# Patient Record
Sex: Female | Born: 1950
Health system: Southern US, Community
[De-identification: ages and names within clinical notes are randomized; demographics above are authoritative.]

## PROBLEM LIST (undated history)

## (undated) DIAGNOSIS — I10 Essential (primary) hypertension: Secondary | ICD-10-CM

---

## 1999-08-14 ENCOUNTER — Encounter: Payer: Self-pay | Admitting: Family Medicine

## 1999-08-14 ENCOUNTER — Ambulatory Visit (HOSPITAL_COMMUNITY): Admission: RE | Admit: 1999-08-14 | Discharge: 1999-08-14 | Payer: Self-pay | Admitting: Family Medicine

## 2008-08-10 ENCOUNTER — Other Ambulatory Visit: Admission: RE | Admit: 2008-08-10 | Discharge: 2008-08-10 | Payer: Self-pay | Admitting: Family Medicine

## 2008-08-17 ENCOUNTER — Ambulatory Visit (HOSPITAL_COMMUNITY): Admission: RE | Admit: 2008-08-17 | Discharge: 2008-08-17 | Payer: Self-pay | Admitting: Family Medicine

## 2009-05-20 ENCOUNTER — Encounter: Admission: RE | Admit: 2009-05-20 | Discharge: 2009-05-20 | Payer: Self-pay | Admitting: Otolaryngology

## 2009-05-23 ENCOUNTER — Encounter: Payer: Self-pay | Admitting: Otolaryngology

## 2009-05-24 ENCOUNTER — Ambulatory Visit (HOSPITAL_COMMUNITY): Admission: RE | Admit: 2009-05-24 | Discharge: 2009-05-24 | Payer: Self-pay | Admitting: Interventional Radiology

## 2009-06-19 ENCOUNTER — Ambulatory Visit (HOSPITAL_COMMUNITY): Admission: RE | Admit: 2009-06-19 | Discharge: 2009-06-20 | Payer: Self-pay | Admitting: Interventional Radiology

## 2009-07-05 ENCOUNTER — Encounter: Payer: Self-pay | Admitting: Interventional Radiology

## 2009-08-19 ENCOUNTER — Encounter: Admission: RE | Admit: 2009-08-19 | Discharge: 2009-08-19 | Payer: Self-pay | Admitting: Family Medicine

## 2009-11-01 ENCOUNTER — Ambulatory Visit (HOSPITAL_COMMUNITY): Admission: RE | Admit: 2009-11-01 | Discharge: 2009-11-01 | Payer: Self-pay | Admitting: Interventional Radiology

## 2010-03-07 ENCOUNTER — Ambulatory Visit (HOSPITAL_COMMUNITY)
Admission: RE | Admit: 2010-03-07 | Discharge: 2010-03-07 | Payer: Self-pay | Source: Home / Self Care | Attending: Interventional Radiology | Admitting: Interventional Radiology

## 2010-03-10 LAB — CBC
HCT: 39.2 % (ref 36.0–46.0)
Hemoglobin: 13.3 g/dL (ref 12.0–15.0)
MCH: 31.4 pg (ref 26.0–34.0)
MCHC: 33.9 g/dL (ref 30.0–36.0)
MCV: 92.7 fL (ref 78.0–100.0)
Platelets: 263 10*3/uL (ref 150–400)
RBC: 4.23 MIL/uL (ref 3.87–5.11)
RDW: 12.3 % (ref 11.5–15.5)
WBC: 4.5 10*3/uL (ref 4.0–10.5)

## 2010-03-10 LAB — BASIC METABOLIC PANEL
BUN: 8 mg/dL (ref 6–23)
CO2: 25 mEq/L (ref 19–32)
Calcium: 10 mg/dL (ref 8.4–10.5)
Chloride: 100 mEq/L (ref 96–112)
Creatinine, Ser: 1 mg/dL (ref 0.4–1.2)
GFR calc Af Amer: >60 mL/min (ref >60)
GFR calc non Af Amer: 57 mL/min — ABNORMAL LOW (ref >60)
Glucose, Bld: 133 mg/dL — ABNORMAL HIGH (ref 70–99)
Potassium: 4.4 mEq/L (ref 3.5–5.1)
Sodium: 137 mEq/L (ref 135–145)

## 2010-03-10 LAB — PROTIME-INR
INR: 1.05 (ref 0.00–1.49)
Prothrombin Time: 13.9 seconds (ref 11.6–15.2)

## 2010-03-10 LAB — APTT: aPTT: 31 seconds (ref 24–37)

## 2010-03-16 ENCOUNTER — Encounter: Payer: Self-pay | Admitting: Family Medicine

## 2010-03-17 ENCOUNTER — Encounter: Payer: Self-pay | Admitting: Family Medicine

## 2010-05-01 ENCOUNTER — Other Ambulatory Visit (HOSPITAL_COMMUNITY): Payer: Self-pay | Admitting: Interventional Radiology

## 2010-05-01 DIAGNOSIS — I770 Arteriovenous fistula, acquired: Secondary | ICD-10-CM

## 2010-05-08 LAB — BASIC METABOLIC PANEL
BUN: 11 mg/dL (ref 6–23)
GFR calc Af Amer: >60 mL/min (ref >60)
GFR calc non Af Amer: 59 mL/min — ABNORMAL LOW (ref >60)
Potassium: 3.8 mEq/L (ref 3.5–5.1)
Sodium: 138 mEq/L (ref 135–145)

## 2010-05-08 LAB — CBC
HCT: 39.4 % (ref 36.0–46.0)
RBC: 4.31 MIL/uL (ref 3.87–5.11)
RDW: 12.2 % (ref 11.5–15.5)
WBC: 3.6 10*3/uL — ABNORMAL LOW (ref 4.0–10.5)

## 2010-05-08 LAB — APTT: aPTT: 27 seconds (ref 24–37)

## 2010-05-08 LAB — PROTIME-INR: INR: 0.92 (ref 0.00–1.49)

## 2010-05-12 ENCOUNTER — Other Ambulatory Visit (HOSPITAL_COMMUNITY)
Admission: RE | Admit: 2010-05-12 | Discharge: 2010-05-12 | Disposition: A | Discharge status: Home / Self Care | Payer: BC Managed Care – PPO | Source: Ambulatory Visit | Attending: Family Medicine | Admitting: Family Medicine

## 2010-05-12 ENCOUNTER — Other Ambulatory Visit: Payer: Self-pay | Admitting: Family Medicine

## 2010-05-12 DIAGNOSIS — Z124 Encounter for screening for malignant neoplasm of cervix: Secondary | ICD-10-CM | POA: Insufficient documentation

## 2010-05-13 LAB — DIFFERENTIAL
Lymphocytes Relative: 62 % — ABNORMAL HIGH (ref 12–46)
Lymphs Abs: 2.4 10*3/uL (ref 0.7–4.0)
Neutrophils Relative %: 26 % — ABNORMAL LOW (ref 43–77)

## 2010-05-13 LAB — BASIC METABOLIC PANEL
BUN: 15 mg/dL (ref 6–23)
Calcium: 8.1 mg/dL — ABNORMAL LOW (ref 8.4–10.5)
Creatinine, Ser: 0.95 mg/dL (ref 0.4–1.2)
Creatinine, Ser: 0.96 mg/dL (ref 0.4–1.2)
GFR calc Af Amer: >60 mL/min (ref >60)
GFR calc Af Amer: >60 mL/min (ref >60)
GFR calc non Af Amer: 60 mL/min — ABNORMAL LOW (ref >60)
GFR calc non Af Amer: >60 mL/min (ref >60)
Potassium: 3.7 mEq/L (ref 3.5–5.1)

## 2010-05-13 LAB — CBC
HCT: 37.3 % (ref 36.0–46.0)
MCHC: 34.7 g/dL (ref 30.0–36.0)
Platelets: 177 10*3/uL (ref 150–400)
RBC: 3.25 MIL/uL — ABNORMAL LOW (ref 3.87–5.11)
WBC: 3.9 10*3/uL — ABNORMAL LOW (ref 4.0–10.5)
WBC: 7.3 10*3/uL (ref 4.0–10.5)

## 2010-05-13 LAB — PROTIME-INR
INR: 0.91 (ref 0.00–1.49)
Prothrombin Time: 12.2 seconds (ref 11.6–15.2)

## 2010-05-13 LAB — HEPARIN ANTI-XA: Heparin LMW: <0.1 IU/mL

## 2010-05-13 LAB — MRSA PCR SCREENING: MRSA by PCR: NEGATIVE

## 2010-05-14 LAB — PROTIME-INR: Prothrombin Time: 13.1 seconds (ref 11.6–15.2)

## 2010-05-14 LAB — CBC
MCV: 95.1 fL (ref 78.0–100.0)
RBC: 4.35 MIL/uL (ref 3.87–5.11)
WBC: 6.4 10*3/uL (ref 4.0–10.5)

## 2010-05-14 LAB — BASIC METABOLIC PANEL
Chloride: 104 mEq/L (ref 96–112)
Creatinine, Ser: 0.94 mg/dL (ref 0.4–1.2)
GFR calc Af Amer: >60 mL/min (ref >60)
Potassium: 3.9 mEq/L (ref 3.5–5.1)

## 2010-05-29 ENCOUNTER — Other Ambulatory Visit (HOSPITAL_COMMUNITY): Payer: Self-pay

## 2010-05-30 ENCOUNTER — Encounter (HOSPITAL_COMMUNITY)
Admission: RE | Admit: 2010-05-30 | Discharge: 2010-05-30 | Disposition: A | Discharge status: Home / Self Care | Payer: BC Managed Care – PPO | Source: Ambulatory Visit | Attending: Interventional Radiology | Admitting: Interventional Radiology

## 2010-05-30 LAB — CBC
Hemoglobin: 12.7 g/dL (ref 12.0–15.0)
MCH: 32.2 pg (ref 26.0–34.0)
RBC: 3.95 MIL/uL (ref 3.87–5.11)
WBC: 4.1 10*3/uL (ref 4.0–10.5)

## 2010-05-30 LAB — BASIC METABOLIC PANEL
CO2: 28 mEq/L (ref 19–32)
Chloride: 106 mEq/L (ref 96–112)
GFR calc Af Amer: >60 mL/min (ref >60)
Potassium: 3.8 mEq/L (ref 3.5–5.1)
Sodium: 137 mEq/L (ref 135–145)

## 2010-05-30 LAB — SURGICAL PCR SCREEN
MRSA, PCR: NEGATIVE
Staphylococcus aureus: NEGATIVE

## 2010-05-30 LAB — APTT: aPTT: 28 seconds (ref 24–37)

## 2010-05-30 LAB — DIFFERENTIAL
Basophils Absolute: 0 10*3/uL (ref 0.0–0.1)
Basophils Relative: 1 % (ref 0–1)
Monocytes Relative: 6 % (ref 3–12)
Neutro Abs: 1.2 10*3/uL — ABNORMAL LOW (ref 1.7–7.7)
Neutrophils Relative %: 29 % — ABNORMAL LOW (ref 43–77)

## 2010-05-30 LAB — PROTIME-INR: INR: 0.93 (ref 0.00–1.49)

## 2010-06-05 ENCOUNTER — Ambulatory Visit (HOSPITAL_COMMUNITY): Payer: BC Managed Care – PPO

## 2010-06-05 ENCOUNTER — Inpatient Hospital Stay (HOSPITAL_COMMUNITY)
Admission: RE | Admit: 2010-06-05 | Discharge: 2010-06-06 | DRG: 110 | Disposition: A | Discharge status: Home / Self Care | Payer: BC Managed Care – PPO | Source: Ambulatory Visit | Attending: Interventional Radiology | Admitting: Interventional Radiology

## 2010-06-05 DIAGNOSIS — I770 Arteriovenous fistula, acquired: Principal | ICD-10-CM | POA: Diagnosis present

## 2010-06-05 DIAGNOSIS — D649 Anemia, unspecified: Secondary | ICD-10-CM | POA: Diagnosis present

## 2010-06-05 DIAGNOSIS — I498 Other specified cardiac arrhythmias: Secondary | ICD-10-CM | POA: Diagnosis present

## 2010-06-05 DIAGNOSIS — E876 Hypokalemia: Secondary | ICD-10-CM | POA: Diagnosis present

## 2010-06-06 LAB — CBC
Hemoglobin: 10.3 g/dL — ABNORMAL LOW (ref 12.0–15.0)
MCHC: 33.9 g/dL (ref 30.0–36.0)
RDW: 13 % (ref 11.5–15.5)
WBC: 8.2 10*3/uL (ref 4.0–10.5)

## 2010-06-06 LAB — PROTIME-INR
INR: 1.1 (ref 0.00–1.49)
Prothrombin Time: 14.4 seconds (ref 11.6–15.2)

## 2010-06-06 LAB — BASIC METABOLIC PANEL
BUN: 9 mg/dL (ref 6–23)
Calcium: 8.3 mg/dL — ABNORMAL LOW (ref 8.4–10.5)
GFR calc non Af Amer: 56 mL/min — ABNORMAL LOW (ref >60)
Glucose, Bld: 102 mg/dL — ABNORMAL HIGH (ref 70–99)

## 2010-06-06 LAB — POCT ACTIVATED CLOTTING TIME: Activated Clotting Time: 205 seconds

## 2010-06-06 LAB — APTT: aPTT: 27 seconds (ref 24–37)

## 2010-06-10 NOTE — H&P (Signed)
Kelli Flores, Kelli Flores            ACCOUNT NO.:  [id number]  MEDICAL RECORD NO.:  [id number]           PATIENT TYPE:  O  LOCATION:  [facility]                         FACILITY:  [facility]  PHYSICIAN:  Kelli Flores, M.D.DATE OF BIRTH:  [birth year]  DATE OF ADMISSION:  06/05/2010 DATE OF DISCHARGE:                             HISTORY & PHYSICAL   CHIEF COMPLAINT:  Dural AV fistula.  BRIEF HISTORY:  Kelli Flores is a very pleasant 60 year old female who was initially referred to Kelli Flores through the courtesy of Dr. Suzanna Flores for evaluation of pulsatile tinnitus.  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  She had a cerebral angiogram on May 24, 2009 that confirmed the arteriovenous fistula.  She underwent embolization of the fistula on June 19, 2009 performed by Kelli Flores under general anesthesia.  She had a follow-up angiogram on March 07, 2010, that showed the angiographically obliterated left occipital branch as well as a posterior auricular branch of the left external carotid artery.  There was a persistent opacification of the medial aspect of the previous arteriovenous fistula with branches from the abnormally prominent ascending pharyngeal artery.  Kelli Flores discussed these findings with the patient and a decision was made to proceed with further endovascular embolization.  The patient is admitted to [facility] on June 05, 2010 for that procedure.  PAST MEDICAL HISTORY:  Significant for the embolization as noted above. She also has a history of hypertension.  Otherwise, she has been very healthy.  SURGICAL HISTORY:  The patient has had a tubal ligation as well as hemorrhoid surgery.  She reports that she is slow to wake up after anesthesia.  ALLERGIES:  THE PATIENT IS ALLERGIC TO CODEINE AND SHELLFISH.  She has received a 13-hour prednisone protocol as well as Benadryl and Pepcid  prior to this intervention.  She also received these medications prior to her previous embolization and angiograms.  CURRENT MEDICATIONS:  Vitamin D as well as atenolol/chlorthalidone.  As noted she had a 13-hour prednisone prep prior to admission.  SOCIAL HISTORY:  The patient is divorced.  She has one son.  She lives in [place].  She has never smoked.  She drinks alcohol occasionally. 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  FAMILY HISTORY:  The patient's mother died at age 80 from congestive heart failure and kidney failure.  Her father died at age 30 from congestive heart failure.  REVIEW OF SYSTEMS:  The review of systems was completely negative except for bilateral carpal tunnel syndrome and some mild arthritis.  LABORATORY DATA:  An INR was 0.93, PTT was 28, BUN was 9, creatinine 0.97, potassium was 3.8, GFR was greater than 60, glucose was 89. Hemoglobin 12.7, hematocrit 36.4, WBC 4100, platelets 207,000.  PHYSICAL EXAM:  GENERAL:  Revealed a very pleasant 60 year old African American female in no acute distress. VITAL SIGNS:  Blood pressure 116/76, pulse 84, respirations 18, temperature 97.5, oxygen saturation 99% on room air. HEENT: Unremarkable. HEART:  Revealed regular rate and rhythm without murmur. LUNGS:  Clear.  EXTREMITIES:  Revealed pulses to be intact without edema. The patient's airway was rated at A1, her ASA scale was A3. NEUROLOGIC:  Revealed the patient to be alert and oriented able to follow three-step commands.  Cranial nerves II-XII are grossly intact. Sensation was intact to light touch.  Cerebellar testing was intact. Motor strength was 5/5 throughout.  IMPRESSION: 1. History of a dural AV fistula, which was partially embolized on     June 19, 2009.  The patient returns today for further embolization     to be performed endovascularly by Kelli Flores under general     anesthesia. 2. History of pulsatile tinnitus which  resolved after the procedure     June 19, 2009. 3. History of carpal tunnel syndrome. 4. History of hypertension. 5. History of sinus problems. 6. History of allergies to shellfish and codeine treated with a 13-     hour prednisone protocol as well as Benadryl and Pepcid.     Delton See, P.A.   ______________________________ Grandville Silos. Kelli Flores, M.D.    DR/MEDQ  D:  06/05/2010  T:  06/05/2010  Job:  [id number]  cc:   Kelli Flores, M.D.  Electronically Signed by Delton See P.A. on 06/06/2010 01:50:03 PM Electronically Signed by Julieanne Cotton M.D. on 06/10/2010 03:55:01 PM

## 2010-06-10 NOTE — Discharge Summary (Signed)
NAMESENA, Kelli Flores            ACCOUNT NO.:  [id number]  MEDICAL RECORD NO.:  [id number]           PATIENT TYPE:  I  LOCATION:  3113                         FACILITY:  [facility]  PHYSICIAN:  Lee Kalt K. Annjanette Wertenberger, M.D.DATE OF BIRTH:  15-Jul-1950  DATE OF ADMISSION:  06/05/2010 DATE OF DISCHARGE:  06/06/2010                              DISCHARGE SUMMARY   CHIEF COMPLAINT:  Dural AV fistula.  BRIEF HISTORY:  Kelli Flores is very pleasant 60 year old female initially referred to Dr. Corliss Skains through the courtesy of Dr. 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  The patient had been suffering with pulsatile tinnitus.  She had a cerebral angiogram on May 24, 2009, that confirmed the arteriovenous fistula.  She underwent partial embolization of the fistula on June 19, 2009, by Dr. Corliss Skains. A followup angiogram on March 07, 2010, showed persistence of a portion of the arteriovenous fistula in the medial aspect.  These findings were discussed with the patient.  A decision was made to proceed with further treatment.  The patient was admitted to [facility] on June 05, 2010, for intervention.  PAST MEDICAL HISTORY:  Significant for the above-noted embolization. She has a history of hypertension.  She has otherwise been very healthy.  SURGICAL HISTORY:  The patient had a tubal ligation as well as hemorrhoid surgery in the past.  She reports that she is slow to wake up after anesthesia.  ALLERGIES:  The patient is allergic to CODEINE and SHELLFISH.  She received a 13-hour prednisone protocol as well as Benadryl and Pepcid prior to admission.  CURRENT MEDICATIONS:  The patient is on a vitamin D supplement as well as atenolol/chlorthalidone combination for her blood pressure.  SOCIAL HISTORY:  The patient is divorced.  She has one son.  She lives in [place].  She has never smoked.  She drinks  alcohol occasionally. She works as a Diplomatic Services operational officer for [employer].  FAMILY HISTORY:  The patient's mother died at age 64 from congestive heart failure and kidney failure.  Her father died at age 5 from congestive heart failure.  HOSPITAL COURSE:  The patient was admitted to [facility] on June 05, 2010, for further embolization of a dural AV fistula.  The initial embolization was performed on June 19, 2009.  A followup cerebral angiogram on March 07, 2010, showed persistence of a portion of the arteriovenous fistula.  On the day of admission, the patient underwent further coiling and Onyx therapy performed by Dr. Corliss Skains under general anesthesia.  Please see his dictated note for full details.  It was felt that the fistula was completely embolized at this point.  Following the procedure, the patient was admitted to the neuro intensive care unit where she remained overnight.  She was treated with IV heparin per Dr. Fatima Sanger protocol.  The following day, the patient was feeling well.  She denied headache, dizziness, or pulsatile tinnitus. The patient was found to have a low potassium level which was supplemented.  She was also noted to have a  low hemoglobin/hematocrit which was felt secondary to fluid hydration.  She had a slow heart rate in the 40s which was felt secondary to her atenolol.  She was asymptomatic with this heart rate.  The heart rate responded appropriately with increased activity.  Initially, her atenolol was held but it was given prior to discharge.  The patient was discharged later in the day on June 06, 2010, in stable and improved condition.  LABORATORY DATA:  A CBC on the day of discharge revealed hemoglobin 10.3, hematocrit 30.4, WBCs 8200, platelets 164,000.  A basic metabolic panel on the day of discharge revealed a BUN of 9, creatinine 1.01, GFR was 56, potassium was 3.3, glucose was 102.  On May 30, 2010,  the patient's hemoglobin had been 12.7, hematocrit 36.4.  DISCHARGE MEDICATIONS:  The patient was told to continue the same medication she was taking prior to admission which included the vitamin D supplement as well as the atenolol/chlorthalidone combination for her hypertension.  The patient was given instructions regarding wound care.  She was told not to do anything strenuous for at least 2 weeks.  She was not to return to work for 2 weeks.  She will follow up with Dr. Corliss Skains in 2 weeks.  She was told to follow up with her primary care physician, Dr. Zachery Dauer and her ENT, Dr. Jearld Fenton as scheduled.  DISCHARGE DIAGNOSES: 1. Dural AV fistula partially embolized on June 19, 2009, with     further embolization performed June 05, 2010. 2. History of hypertension. 3. History of allergy to shellfish and codeine.  The patient was given     a 13-hour prednisone protocol, as well as Benadryl and Pepcid prior     to her interventions. 4. Hypokalemia supplemented. 5. Mild anemia secondary to hydration. 6. Bradycardia, asymptomatic, although the patient was told to follow     up with her primary care physician for further evaluation. 7. History of carpal tunnel syndrome. 8. History of sinus problems.     Delton See, P.A.   ______________________________ Grandville Silos. Corliss Skains, M.D.    DR/MEDQ  D:  06/06/2010  T:  06/07/2010  Job:  [id number]  cc:   Suzanna Obey, M.D. Dr. Juluis Rainier  Electronically Signed by Delton See P.A. on 06/09/2010 11:02:54 AM Electronically Signed by Julieanne Cotton M.D. on 06/10/2010 03:55:02 PM

## 2010-06-19 ENCOUNTER — Other Ambulatory Visit (HOSPITAL_COMMUNITY): Payer: BC Managed Care – PPO

## 2010-06-23 ENCOUNTER — Ambulatory Visit (HOSPITAL_COMMUNITY)
Admission: RE | Admit: 2010-06-23 | Discharge: 2010-06-23 | Disposition: A | Discharge status: Home / Self Care | Payer: BC Managed Care – PPO | Source: Ambulatory Visit | Attending: Interventional Radiology | Admitting: Interventional Radiology

## 2010-07-16 ENCOUNTER — Other Ambulatory Visit (HOSPITAL_COMMUNITY): Payer: Self-pay | Admitting: Family Medicine

## 2010-07-16 DIAGNOSIS — Z1231 Encounter for screening mammogram for malignant neoplasm of breast: Secondary | ICD-10-CM

## 2010-09-01 ENCOUNTER — Ambulatory Visit (HOSPITAL_COMMUNITY)
Admission: RE | Admit: 2010-09-01 | Discharge: 2010-09-01 | Disposition: A | Discharge status: Home / Self Care | Payer: BC Managed Care – PPO | Source: Ambulatory Visit | Attending: Family Medicine | Admitting: Family Medicine

## 2010-09-01 DIAGNOSIS — Z1231 Encounter for screening mammogram for malignant neoplasm of breast: Secondary | ICD-10-CM | POA: Insufficient documentation

## 2010-12-29 ENCOUNTER — Other Ambulatory Visit (HOSPITAL_COMMUNITY): Payer: Self-pay | Admitting: Interventional Radiology

## 2010-12-29 DIAGNOSIS — Z09 Encounter for follow-up examination after completed treatment for conditions other than malignant neoplasm: Secondary | ICD-10-CM

## 2010-12-29 DIAGNOSIS — L988 Other specified disorders of the skin and subcutaneous tissue: Secondary | ICD-10-CM

## 2011-01-08 ENCOUNTER — Other Ambulatory Visit (HOSPITAL_COMMUNITY): Payer: BC Managed Care – PPO

## 2011-01-08 ENCOUNTER — Ambulatory Visit (HOSPITAL_COMMUNITY): Payer: BC Managed Care – PPO

## 2011-01-08 ENCOUNTER — Other Ambulatory Visit (HOSPITAL_COMMUNITY): Payer: Self-pay

## 2011-01-08 ENCOUNTER — Other Ambulatory Visit (HOSPITAL_COMMUNITY): Payer: Self-pay | Admitting: Interventional Radiology

## 2011-01-08 DIAGNOSIS — Z01812 Encounter for preprocedural laboratory examination: Secondary | ICD-10-CM

## 2011-01-09 ENCOUNTER — Ambulatory Visit (HOSPITAL_COMMUNITY)
Admission: RE | Admit: 2011-01-09 | Discharge: 2011-01-09 | Disposition: A | Discharge status: Home / Self Care | Payer: BC Managed Care – PPO | Source: Ambulatory Visit | Attending: Interventional Radiology | Admitting: Interventional Radiology

## 2011-01-09 DIAGNOSIS — Z09 Encounter for follow-up examination after completed treatment for conditions other than malignant neoplasm: Secondary | ICD-10-CM | POA: Insufficient documentation

## 2011-01-09 DIAGNOSIS — L988 Other specified disorders of the skin and subcutaneous tissue: Secondary | ICD-10-CM

## 2011-01-09 DIAGNOSIS — I729 Aneurysm of unspecified site: Secondary | ICD-10-CM | POA: Insufficient documentation

## 2011-01-09 MED ORDER — GADOBENATE DIMEGLUMINE 529 MG/ML IV SOLN
15.0000 mL | Freq: Once | INTRAVENOUS | Status: AC
  Administered 2011-01-09: 15 mL via INTRAVENOUS

## 2011-07-01 ENCOUNTER — Telehealth (HOSPITAL_COMMUNITY): Payer: Self-pay

## 2011-07-01 NOTE — Telephone Encounter (Signed)
Called Mrs. Grissom in regards to scheduling her f/u angio.

## 2011-07-03 ENCOUNTER — Other Ambulatory Visit (HOSPITAL_COMMUNITY): Payer: Self-pay | Admitting: Interventional Radiology

## 2011-07-03 DIAGNOSIS — I671 Cerebral aneurysm, nonruptured: Secondary | ICD-10-CM

## 2011-07-03 DIAGNOSIS — I729 Aneurysm of unspecified site: Secondary | ICD-10-CM

## 2011-07-08 ENCOUNTER — Other Ambulatory Visit: Payer: Self-pay | Admitting: Radiology

## 2011-07-08 ENCOUNTER — Telehealth (HOSPITAL_COMMUNITY): Payer: Self-pay

## 2011-07-08 NOTE — Telephone Encounter (Signed)
Called Kelli Flores about her shellfish ax. Left a message for her to call me back.

## 2011-07-10 ENCOUNTER — Ambulatory Visit (HOSPITAL_COMMUNITY): Payer: BC Managed Care – PPO

## 2011-07-16 ENCOUNTER — Encounter (HOSPITAL_COMMUNITY): Payer: Self-pay | Admitting: Pharmacy Technician

## 2011-07-17 ENCOUNTER — Ambulatory Visit (HOSPITAL_COMMUNITY)
Admission: RE | Admit: 2011-07-17 | Discharge: 2011-07-17 | Disposition: A | Discharge status: Home / Self Care | Payer: BC Managed Care – PPO | Source: Ambulatory Visit | Attending: Interventional Radiology | Admitting: Interventional Radiology

## 2011-07-17 ENCOUNTER — Other Ambulatory Visit (HOSPITAL_COMMUNITY): Payer: Self-pay | Admitting: Interventional Radiology

## 2011-07-17 ENCOUNTER — Encounter (HOSPITAL_COMMUNITY): Payer: Self-pay

## 2011-07-17 DIAGNOSIS — H9319 Tinnitus, unspecified ear: Secondary | ICD-10-CM | POA: Insufficient documentation

## 2011-07-17 DIAGNOSIS — I671 Cerebral aneurysm, nonruptured: Secondary | ICD-10-CM | POA: Insufficient documentation

## 2011-07-17 DIAGNOSIS — I10 Essential (primary) hypertension: Secondary | ICD-10-CM | POA: Insufficient documentation

## 2011-07-17 DIAGNOSIS — I729 Aneurysm of unspecified site: Secondary | ICD-10-CM

## 2011-07-17 HISTORY — DX: Essential (primary) hypertension: I10

## 2011-07-17 LAB — BASIC METABOLIC PANEL
CO2: 25 mEq/L (ref 19–32)
Calcium: 9.9 mg/dL (ref 8.4–10.5)
Chloride: 92 mEq/L — ABNORMAL LOW (ref 96–112)
Glucose, Bld: 122 mg/dL — ABNORMAL HIGH (ref 70–99)
Potassium: 4.2 mEq/L (ref 3.5–5.1)
Sodium: 132 mEq/L — ABNORMAL LOW (ref 135–145)

## 2011-07-17 LAB — CBC
HCT: 40 % (ref 36.0–46.0)
Hemoglobin: 14 g/dL (ref 12.0–15.0)
MCV: 92 fL (ref 78.0–100.0)
Platelets: 253 10*3/uL (ref 150–400)
RBC: 4.35 MIL/uL (ref 3.87–5.11)
WBC: 6 10*3/uL (ref 4.0–10.5)

## 2011-07-17 LAB — APTT: aPTT: 28 seconds (ref 24–37)

## 2011-07-17 LAB — DIFFERENTIAL
Eosinophils Relative: 0 % (ref 0–5)
Lymphocytes Relative: 22 % (ref 12–46)
Lymphs Abs: 1.3 10*3/uL (ref 0.7–4.0)
Monocytes Relative: 1 % — ABNORMAL LOW (ref 3–12)

## 2011-07-17 LAB — PROTIME-INR: Prothrombin Time: 12.8 seconds (ref 11.6–15.2)

## 2011-07-17 MED ORDER — FENTANYL CITRATE 0.05 MG/ML IJ SOLN
INTRAMUSCULAR | Status: AC
  Filled 2011-07-17: qty 4

## 2011-07-17 MED ORDER — MIDAZOLAM HCL 2 MG/2ML IJ SOLN
INTRAMUSCULAR | Status: AC
  Filled 2011-07-17: qty 4

## 2011-07-17 MED ORDER — IOHEXOL 300 MG/ML  SOLN
150.0000 mL | Freq: Once | INTRAMUSCULAR | Status: AC | PRN
  Administered 2011-07-17: 65 mL via INTRA_ARTERIAL

## 2011-07-17 MED ORDER — SODIUM CHLORIDE 0.9 % IV SOLN
Freq: Once | INTRAVENOUS | Status: AC
  Administered 2011-07-17: 1000 mL via INTRAVENOUS

## 2011-07-17 MED ORDER — SODIUM CHLORIDE 0.9 % IV SOLN: INTRAVENOUS | Status: AC

## 2011-07-17 MED ORDER — FENTANYL CITRATE 0.05 MG/ML IJ SOLN
INTRAMUSCULAR | Status: AC | PRN
  Administered 2011-07-17: 12.5 ug via INTRAVENOUS
  Administered 2011-07-17: 25 ug via INTRAVENOUS

## 2011-07-17 MED ORDER — MIDAZOLAM HCL 5 MG/5ML IJ SOLN
INTRAMUSCULAR | Status: AC | PRN
  Administered 2011-07-17: 1 mg via INTRAVENOUS
  Administered 2011-07-17: 0.5 mg via INTRAVENOUS

## 2011-07-17 MED ORDER — HEPARIN SOD (PORK) LOCK FLUSH 100 UNIT/ML IV SOLN
INTRAVENOUS | Status: AC | PRN
  Administered 2011-07-17 (×2): 500 [IU] via INTRAVENOUS

## 2011-07-17 NOTE — Procedures (Signed)
S/P Bilateral carotid and lt vert artery fistula Rt CFA approach Preliminary findings 1.samll  avfistula at skull base fed by musculo collateral fed by Baldemar Friday A

## 2011-07-17 NOTE — Discharge Instructions (Signed)

## 2011-07-17 NOTE — H&P (Signed)
Chief Complaint: Hx of left sided dural AVF embolization. HPI: Kelli Flores is an 61 y.o. female with hx of (L)dural AVF emb in 2011 and subsequent in 2012. She has done well and is scheduled for her 1 year follow up angio. She does report the onset of some ringing in her (L)ear that started about 2 months ago. She mainly notices it at night when it is quiet, but also when she feels under a lot of stress. No new headaches, dizziness, hearing or vision loss. She has been started on an ASA 81mg  by her PCP a few months ago, states her BP is well controlled. No other recent illnesses or complaints  Past Medical History:  Past Medical History  Diagnosis Date  . Hypertension     Past Surgical History: (L) dural AVF embolization  Family History: History reviewed. No pertinent family history.  Social History:  reports that she has never smoked. She does not have any smokeless tobacco history on file. She reports that she does not drink alcohol. Her drug history not on file.  Allergies:  Allergies  Allergen Reactions  . Shellfish Allergy Hives  . Codeine Rash    Medications: ASA, Tenoretic, Calcium, Vit D  Please HPI for pertinent positives, otherwise complete 10 system ROS negative.  Physical Exam: Blood pressure 133/77, pulse 47, temperature 96.8 F (36 C), temperature source Oral, resp. rate 18, height 5' 4.5" (1.638 m), weight 145 lb (65.772 kg), SpO2 100.00%. Body mass index is 24.50 kg/(m^2).   General Appearance:  Alert, cooperative, no distress, appears stated age  Head:  Normocephalic, without obvious abnormality, atraumatic  ENT: Unremarkable  Neck: Supple, symmetrical, trachea midline, no adenopathy, thyroid: not enlarged, symmetric, no tenderness/mass/nodules  Lungs:   Clear to auscultation bilaterally, no w/r/r, respirations unlabored without use of accessory muscles.  Chest Wall:  No tenderness or deformity  Heart:  Regular rate and rhythm, S1, S2 normal, no murmur,  rub or gallop. Carotids 2+ without bruit.  Abdomen:   Soft, non-tender, non distended. Bowel sounds active all four quadrants,  no masses, no organomegaly.  Extremities: Extremities normal, atraumatic, no cyanosis or edema  Pulses: 2+ and symmetric  Skin: Skin color, texture, turgor normal, no rashes or lesions  Neurologic: Normal affect, no gross deficits.   Results for orders placed during the hospital encounter of 07/17/11 (from the past 48 hour(s))  APTT     Status: Normal   Collection Time   07/17/11  8:19 AM      Component Value Range Comment   aPTT 28  24 - 37 (seconds)   CBC     Status: Normal   Collection Time   07/17/11  8:19 AM      Component Value Range Comment   WBC 6.0  4.0 - 10.5 (K/uL)    RBC 4.35  3.87 - 5.11 (MIL/uL)    Hemoglobin 14.0  12.0 - 15.0 (g/dL)    HCT 45.4  09.8 - 11.9 (%)    MCV 92.0  78.0 - 100.0 (fL)    MCH 32.2  26.0 - 34.0 (pg)    MCHC 35.0  30.0 - 36.0 (g/dL)    RDW 14.7  82.9 - 56.2 (%)    Platelets 253  150 - 400 (K/uL)   DIFFERENTIAL     Status: Abnormal   Collection Time   07/17/11  8:19 AM      Component Value Range Comment   Neutrophils Relative 77  43 - 77 (%)  Neutro Abs 4.6  1.7 - 7.7 (K/uL)    Lymphocytes Relative 22  12 - 46 (%)    Lymphs Abs 1.3  0.7 - 4.0 (K/uL)    Monocytes Relative 1 (*) 3 - 12 (%)    Monocytes Absolute 0.1  0.1 - 1.0 (K/uL)    Eosinophils Relative 0  0 - 5 (%)    Eosinophils Absolute 0.0  0.0 - 0.7 (K/uL)    Basophils Relative 0  0 - 1 (%)    Basophils Absolute 0.0  0.0 - 0.1 (K/uL)   PROTIME-INR     Status: Normal   Collection Time   07/17/11  8:19 AM      Component Value Range Comment   Prothrombin Time 12.8  11.6 - 15.2 (seconds)    INR 0.94  0.00 - 1.49     No results found.  Assessment/Plan Hx of (L)sided dural AV fistula embolization New onset (L)sided tinnitus For cerebral arteriogram today. Explained procedure including risk and complications. Labs ok except BMET pending Consent signed in  chart.  Brayton El PA-C 07/17/2011, 8:58 AM

## 2011-07-27 ENCOUNTER — Other Ambulatory Visit (HOSPITAL_COMMUNITY): Payer: Self-pay | Admitting: Family Medicine

## 2011-07-27 DIAGNOSIS — Z1231 Encounter for screening mammogram for malignant neoplasm of breast: Secondary | ICD-10-CM

## 2011-09-04 ENCOUNTER — Ambulatory Visit (HOSPITAL_COMMUNITY)
Admission: RE | Admit: 2011-09-04 | Discharge: 2011-09-04 | Disposition: A | Discharge status: Home / Self Care | Payer: BC Managed Care – PPO | Source: Ambulatory Visit | Attending: Family Medicine | Admitting: Family Medicine

## 2011-09-04 DIAGNOSIS — Z1231 Encounter for screening mammogram for malignant neoplasm of breast: Secondary | ICD-10-CM

## 2012-01-29 ENCOUNTER — Telehealth (HOSPITAL_COMMUNITY): Payer: Self-pay | Admitting: Interventional Radiology

## 2012-01-29 ENCOUNTER — Other Ambulatory Visit (HOSPITAL_COMMUNITY): Payer: Self-pay | Admitting: Interventional Radiology

## 2012-01-29 DIAGNOSIS — I770 Arteriovenous fistula, acquired: Secondary | ICD-10-CM

## 2012-02-02 ENCOUNTER — Encounter (HOSPITAL_COMMUNITY): Payer: Self-pay | Admitting: Pharmacy Technician

## 2012-02-02 ENCOUNTER — Other Ambulatory Visit: Payer: Self-pay | Admitting: Radiology

## 2012-02-10 ENCOUNTER — Other Ambulatory Visit: Payer: Self-pay | Admitting: Radiology

## 2012-02-11 ENCOUNTER — Other Ambulatory Visit (HOSPITAL_COMMUNITY): Payer: Self-pay | Admitting: Interventional Radiology

## 2012-02-11 ENCOUNTER — Ambulatory Visit (HOSPITAL_COMMUNITY)
Admission: RE | Admit: 2012-02-11 | Discharge: 2012-02-11 | Disposition: A | Discharge status: Home / Self Care | Payer: BC Managed Care – PPO | Source: Ambulatory Visit | Attending: Interventional Radiology | Admitting: Interventional Radiology

## 2012-02-11 ENCOUNTER — Encounter (HOSPITAL_COMMUNITY): Payer: Self-pay

## 2012-02-11 DIAGNOSIS — I770 Arteriovenous fistula, acquired: Secondary | ICD-10-CM

## 2012-02-11 DIAGNOSIS — I10 Essential (primary) hypertension: Secondary | ICD-10-CM | POA: Insufficient documentation

## 2012-02-11 DIAGNOSIS — R51 Headache: Secondary | ICD-10-CM | POA: Insufficient documentation

## 2012-02-11 DIAGNOSIS — Z9889 Other specified postprocedural states: Secondary | ICD-10-CM | POA: Insufficient documentation

## 2012-02-11 LAB — CBC WITH DIFFERENTIAL/PLATELET
Eosinophils Absolute: 0 10*3/uL (ref 0.0–0.7)
Eosinophils Relative: 0 % (ref 0–5)
Hemoglobin: 13.9 g/dL (ref 12.0–15.0)
Lymphocytes Relative: 27 % (ref 12–46)
Lymphs Abs: 0.9 10*3/uL (ref 0.7–4.0)
MCH: 31.7 pg (ref 26.0–34.0)
MCV: 90.4 fL (ref 78.0–100.0)
Monocytes Relative: 1 % — ABNORMAL LOW (ref 3–12)
RBC: 4.38 MIL/uL (ref 3.87–5.11)
WBC: 3.3 10*3/uL — ABNORMAL LOW (ref 4.0–10.5)

## 2012-02-11 LAB — APTT: aPTT: 30 seconds (ref 24–37)

## 2012-02-11 LAB — BASIC METABOLIC PANEL
CO2: 23 mEq/L (ref 19–32)
Chloride: 102 mEq/L (ref 96–112)
Glucose, Bld: 138 mg/dL — ABNORMAL HIGH (ref 70–99)
Potassium: 3.5 mEq/L (ref 3.5–5.1)
Sodium: 139 mEq/L (ref 135–145)

## 2012-02-11 LAB — PROTIME-INR: INR: 0.97 (ref 0.00–1.49)

## 2012-02-11 MED ORDER — MIDAZOLAM HCL 2 MG/2ML IJ SOLN
INTRAMUSCULAR | Status: DC | PRN
  Administered 2012-02-11: 1 mg via INTRAVENOUS

## 2012-02-11 MED ORDER — FENTANYL CITRATE 0.05 MG/ML IJ SOLN
INTRAMUSCULAR | Status: AC
  Filled 2012-02-11: qty 4

## 2012-02-11 MED ORDER — FENTANYL CITRATE 0.05 MG/ML IJ SOLN
INTRAMUSCULAR | Status: DC | PRN
  Administered 2012-02-11: 25 ug via INTRAVENOUS

## 2012-02-11 MED ORDER — HEPARIN SODIUM (PORCINE) 1000 UNIT/ML IJ SOLN
INTRAMUSCULAR | Status: DC | PRN
  Administered 2012-02-11 (×2): 500 [IU] via INTRAVENOUS

## 2012-02-11 MED ORDER — IOHEXOL 300 MG/ML  SOLN
150.0000 mL | Freq: Once | INTRAMUSCULAR | Status: AC | PRN
  Administered 2012-02-11: 65 mL via INTRA_ARTERIAL

## 2012-02-11 MED ORDER — SODIUM CHLORIDE 0.9 % IV SOLN: INTRAVENOUS | Status: AC

## 2012-02-11 MED ORDER — SODIUM CHLORIDE 0.9 % IV SOLN: Freq: Once | INTRAVENOUS | Status: DC

## 2012-02-11 MED ORDER — MIDAZOLAM HCL 2 MG/2ML IJ SOLN
INTRAMUSCULAR | Status: AC
  Filled 2012-02-11: qty 4

## 2012-02-11 NOTE — Progress Notes (Signed)
1330 Ambulatory about department without sign of bleeding or hematoma to right groin. No complaints

## 2012-02-11 NOTE — Procedures (Signed)
S/P bilateral common carotid artery and Lt vertebral artery angiogram . Rt CFa approach.. Findings. .Persistent DAVF supplied by muscular l branch of distal Lt VA.Dural venous isnuses are patent

## 2012-02-11 NOTE — H&P (Signed)
Kelli Flores is an 61 y.o. female.   Chief Complaint: L Dural ArterioVenous fistula Embolization 05/2009 Residual fistula embolization 05/2010 Scheduled now for recheck cerebral arteriogram Pt asymptomatic HPI: HTN  Past Medical History  Diagnosis Date  . Hypertension     No past surgical history on file.  No family history on file. Social History:  reports that she has never smoked. She does not have any smokeless tobacco history on file. She reports that she does not drink alcohol. Her drug history not on file.  Allergies:  Allergies  Allergen Reactions  . Shellfish Allergy Hives  . Codeine Rash     (Not in a hospital admission)  No results found for this or any previous visit (from the past 48 hour(s)). No results found.  Review of Systems  Constitutional: Negative for fever.  Respiratory: Negative for shortness of breath.   Cardiovascular: Negative for chest pain.  Gastrointestinal: Negative for nausea, vomiting and abdominal pain.  Neurological: Negative for dizziness, weakness and headaches.    Blood pressure 137/94, pulse 59, temperature 98 F (36.7 C), temperature source Oral, resp. rate 18, height 5\' 4"  (1.626 m), weight 141 lb (63.957 kg), SpO2 97.00%. Physical Exam  Constitutional: She is oriented to person, place, and time. She appears well-developed and well-nourished.  Eyes: EOM are normal.  Cardiovascular: Normal rate, regular rhythm and normal heart sounds.   No murmur heard. Respiratory: Effort normal and breath sounds normal. She has no wheezes.  GI: Soft. Bowel sounds are normal. There is no tenderness.  Neurological: She is alert and oriented to person, place, and time.  Skin: Skin is warm and dry.  Psychiatric: She has a normal mood and affect. Her behavior is normal. Judgment and thought content normal.     Assessment/Plan L Dural AV fistula embo 05/2009 Re embo 05/2010 Scheduled now for cerebral arteriogram for recheck Pt aware of  procedure benefits and risks and agreeable to proceed Consent signed and in chart  Zyanya Glaza A 02/11/2012, 8:05 AM

## 2012-02-12 ENCOUNTER — Telehealth (HOSPITAL_COMMUNITY): Payer: Self-pay | Admitting: *Deleted

## 2012-09-28 ENCOUNTER — Telehealth (HOSPITAL_COMMUNITY): Payer: Self-pay | Admitting: Interventional Radiology

## 2012-09-28 NOTE — Telephone Encounter (Signed)
Called [employer], they state that Dr. Corliss Skains must have a peer to peer review for approval. I called pt to let her know this and that I will call her back when I have and answer. She states understanding and approval of this plan. JM

## 2012-09-30 ENCOUNTER — Other Ambulatory Visit (HOSPITAL_COMMUNITY): Payer: Self-pay | Admitting: Interventional Radiology

## 2012-09-30 DIAGNOSIS — H9319 Tinnitus, unspecified ear: Secondary | ICD-10-CM

## 2012-09-30 DIAGNOSIS — I671 Cerebral aneurysm, nonruptured: Secondary | ICD-10-CM

## 2012-10-08 ENCOUNTER — Other Ambulatory Visit (HOSPITAL_COMMUNITY): Payer: Self-pay | Admitting: Interventional Radiology

## 2012-10-08 DIAGNOSIS — H9319 Tinnitus, unspecified ear: Secondary | ICD-10-CM

## 2012-10-08 DIAGNOSIS — I671 Cerebral aneurysm, nonruptured: Secondary | ICD-10-CM

## 2012-10-14 ENCOUNTER — Ambulatory Visit (HOSPITAL_COMMUNITY)
Admission: RE | Admit: 2012-10-14 | Discharge: 2012-10-14 | Disposition: A | Discharge status: Home / Self Care | Payer: BC Managed Care – PPO | Source: Ambulatory Visit | Attending: Interventional Radiology | Admitting: Interventional Radiology

## 2012-10-14 ENCOUNTER — Ambulatory Visit (HOSPITAL_COMMUNITY): Admission: RE | Admit: 2012-10-14 | Payer: BC Managed Care – PPO | Source: Ambulatory Visit

## 2012-10-14 DIAGNOSIS — H9319 Tinnitus, unspecified ear: Secondary | ICD-10-CM | POA: Insufficient documentation

## 2012-10-14 DIAGNOSIS — J3489 Other specified disorders of nose and nasal sinuses: Secondary | ICD-10-CM | POA: Insufficient documentation

## 2012-10-14 DIAGNOSIS — I671 Cerebral aneurysm, nonruptured: Secondary | ICD-10-CM

## 2012-10-14 LAB — CREATININE, SERUM
GFR calc Af Amer: 73 mL/min — ABNORMAL LOW (ref >90)
GFR calc non Af Amer: 63 mL/min — ABNORMAL LOW (ref >90)

## 2012-10-14 MED ORDER — GADOBENATE DIMEGLUMINE 529 MG/ML IV SOLN
15.0000 mL | Freq: Once | INTRAVENOUS | Status: AC
  Administered 2012-10-14: 14 mL via INTRAVENOUS

## 2012-10-28 ENCOUNTER — Other Ambulatory Visit (HOSPITAL_COMMUNITY): Payer: Self-pay | Admitting: Family Medicine

## 2012-10-28 DIAGNOSIS — Z1231 Encounter for screening mammogram for malignant neoplasm of breast: Secondary | ICD-10-CM

## 2012-11-11 ENCOUNTER — Ambulatory Visit (HOSPITAL_COMMUNITY)
Admission: RE | Admit: 2012-11-11 | Discharge: 2012-11-11 | Disposition: A | Discharge status: Home / Self Care | Payer: BC Managed Care – PPO | Source: Ambulatory Visit | Attending: Family Medicine | Admitting: Family Medicine

## 2012-11-11 DIAGNOSIS — Z1231 Encounter for screening mammogram for malignant neoplasm of breast: Secondary | ICD-10-CM | POA: Insufficient documentation

## 2013-06-08 ENCOUNTER — Telehealth (HOSPITAL_COMMUNITY): Payer: Self-pay | Admitting: Interventional Radiology

## 2013-06-08 ENCOUNTER — Other Ambulatory Visit (HOSPITAL_COMMUNITY): Payer: Self-pay | Admitting: Interventional Radiology

## 2013-06-08 DIAGNOSIS — H9319 Tinnitus, unspecified ear: Secondary | ICD-10-CM

## 2013-06-08 DIAGNOSIS — I770 Arteriovenous fistula, acquired: Secondary | ICD-10-CM

## 2013-06-08 NOTE — Telephone Encounter (Signed)
Called pharmacy [facility] [place] [phone]) gave pharmacist prescription info for Prednisone 50mg  x3 tablets take 1 tablet 13 hours prior to procedure, take 1 tablet 7 hours prior to procedure, and take the third tablet 1 hour prior to procedure along with a Benadryl 50mg  tablet 1 hour prior to procedure. JM

## 2013-06-14 ENCOUNTER — Other Ambulatory Visit: Payer: Self-pay | Admitting: Radiology

## 2013-06-15 ENCOUNTER — Encounter (HOSPITAL_COMMUNITY): Payer: Self-pay

## 2013-06-23 ENCOUNTER — Other Ambulatory Visit (HOSPITAL_COMMUNITY): Payer: Self-pay | Admitting: Interventional Radiology

## 2013-06-23 ENCOUNTER — Ambulatory Visit (HOSPITAL_COMMUNITY)
Admission: RE | Admit: 2013-06-23 | Discharge: 2013-06-23 | Disposition: A | Discharge status: Home / Self Care | Payer: BC Managed Care – PPO | Source: Ambulatory Visit | Attending: Interventional Radiology | Admitting: Interventional Radiology

## 2013-06-23 ENCOUNTER — Encounter (HOSPITAL_COMMUNITY): Payer: Self-pay

## 2013-06-23 DIAGNOSIS — I671 Cerebral aneurysm, nonruptured: Secondary | ICD-10-CM | POA: Insufficient documentation

## 2013-06-23 DIAGNOSIS — H9319 Tinnitus, unspecified ear: Secondary | ICD-10-CM

## 2013-06-23 DIAGNOSIS — I770 Arteriovenous fistula, acquired: Secondary | ICD-10-CM

## 2013-06-23 DIAGNOSIS — I10 Essential (primary) hypertension: Secondary | ICD-10-CM | POA: Insufficient documentation

## 2013-06-23 DIAGNOSIS — Z09 Encounter for follow-up examination after completed treatment for conditions other than malignant neoplasm: Secondary | ICD-10-CM | POA: Insufficient documentation

## 2013-06-23 DIAGNOSIS — Z7982 Long term (current) use of aspirin: Secondary | ICD-10-CM | POA: Insufficient documentation

## 2013-06-23 DIAGNOSIS — Z9889 Other specified postprocedural states: Secondary | ICD-10-CM | POA: Insufficient documentation

## 2013-06-23 LAB — CBC WITH DIFFERENTIAL/PLATELET
BASOS PCT: 0 % (ref 0–1)
Basophils Absolute: 0 10*3/uL (ref 0.0–0.1)
EOS ABS: 0 10*3/uL (ref 0.0–0.7)
EOS PCT: 0 % (ref 0–5)
HCT: 38.3 % (ref 36.0–46.0)
Hemoglobin: 13.6 g/dL (ref 12.0–15.0)
LYMPHS ABS: 1.2 10*3/uL (ref 0.7–4.0)
Lymphocytes Relative: 29 % (ref 12–46)
MCH: 32.5 pg (ref 26.0–34.0)
MCHC: 35.5 g/dL (ref 30.0–36.0)
MCV: 91.6 fL (ref 78.0–100.0)
Monocytes Absolute: 0.1 10*3/uL (ref 0.1–1.0)
Monocytes Relative: 2 % — ABNORMAL LOW (ref 3–12)
NEUTROS PCT: 69 % (ref 43–77)
Neutro Abs: 2.9 10*3/uL (ref 1.7–7.7)
Platelets: 263 10*3/uL (ref 150–400)
RBC: 4.18 MIL/uL (ref 3.87–5.11)
RDW: 12.6 % (ref 11.5–15.5)
WBC: 4.1 10*3/uL (ref 4.0–10.5)

## 2013-06-23 LAB — BASIC METABOLIC PANEL
BUN: 14 mg/dL (ref 6–23)
CO2: 23 meq/L (ref 19–32)
Calcium: 10 mg/dL (ref 8.4–10.5)
Chloride: 101 mEq/L (ref 96–112)
Creatinine, Ser: 0.9 mg/dL (ref 0.50–1.10)
GFR calc Af Amer: 78 mL/min — ABNORMAL LOW (ref >90)
GFR, EST NON AFRICAN AMERICAN: 67 mL/min — AB (ref >90)
GLUCOSE: 132 mg/dL — AB (ref 70–99)
Potassium: 3.9 mEq/L (ref 3.7–5.3)
SODIUM: 140 meq/L (ref 137–147)

## 2013-06-23 LAB — PROTIME-INR
INR: 0.98 (ref 0.00–1.49)
Prothrombin Time: 12.8 seconds (ref 11.6–15.2)

## 2013-06-23 LAB — APTT: aPTT: 28 seconds (ref 24–37)

## 2013-06-23 MED ORDER — IOHEXOL 300 MG/ML  SOLN
150.0000 mL | Freq: Once | INTRAMUSCULAR | Status: AC | PRN
  Administered 2013-06-23: 80 mL via INTRA_ARTERIAL

## 2013-06-23 MED ORDER — HYDRALAZINE HCL 20 MG/ML IJ SOLN
INTRAMUSCULAR | Status: AC
  Filled 2013-06-23: qty 1

## 2013-06-23 MED ORDER — MIDAZOLAM HCL 2 MG/2ML IJ SOLN
INTRAMUSCULAR | Status: AC | PRN
  Administered 2013-06-23: 1 mg via INTRAVENOUS

## 2013-06-23 MED ORDER — SODIUM CHLORIDE 0.9 % IV SOLN
Freq: Once | INTRAVENOUS | Status: AC
  Administered 2013-06-23: 09:00:00 via INTRAVENOUS

## 2013-06-23 MED ORDER — PREDNISONE 50 MG PO TABS
50.0000 mg | ORAL_TABLET | Freq: Once | ORAL | Status: AC
  Administered 2013-06-23: 50 mg via ORAL
  Filled 2013-06-23: qty 1

## 2013-06-23 MED ORDER — FAMOTIDINE IN NACL 20-0.9 MG/50ML-% IV SOLN
INTRAVENOUS | Status: AC
  Filled 2013-06-23: qty 50

## 2013-06-23 MED ORDER — MIDAZOLAM HCL 2 MG/2ML IJ SOLN
INTRAMUSCULAR | Status: AC
  Filled 2013-06-23: qty 2

## 2013-06-23 MED ORDER — HEPARIN SOD (PORK) LOCK FLUSH 100 UNIT/ML IV SOLN
INTRAVENOUS | Status: AC | PRN
  Administered 2013-06-23: 1000 [IU] via INTRAVENOUS

## 2013-06-23 MED ORDER — SODIUM CHLORIDE 0.9 % IV SOLN: INTRAVENOUS | Status: AC

## 2013-06-23 MED ORDER — FENTANYL CITRATE 0.05 MG/ML IJ SOLN
INTRAMUSCULAR | Status: AC | PRN
  Administered 2013-06-23: 25 ug via INTRAVENOUS

## 2013-06-23 MED ORDER — DIPHENHYDRAMINE HCL 50 MG PO CAPS
50.0000 mg | ORAL_CAPSULE | Freq: Once | ORAL | Status: AC
  Administered 2013-06-23: 50 mg via ORAL
  Filled 2013-06-23 (×2): qty 1

## 2013-06-23 MED ORDER — FAMOTIDINE IN NACL 20-0.9 MG/50ML-% IV SOLN
20.0000 mg | Freq: Once | INTRAVENOUS | Status: AC
  Administered 2013-06-23: 20 mg via INTRAVENOUS

## 2013-06-23 MED ORDER — FENTANYL CITRATE 0.05 MG/ML IJ SOLN
INTRAMUSCULAR | Status: AC
  Filled 2013-06-23: qty 2

## 2013-06-23 NOTE — Sedation Documentation (Signed)
Pedal pulses are 3+

## 2013-06-23 NOTE — Procedures (Signed)
S/P 4 vessel cerebal arteriogram. Rt CFa approach. Findings. 1.Small residual Lt skull base fistula fed by a small dital Lt VA meningeal br

## 2013-06-23 NOTE — H&P (Signed)
Chief Complaint: "I'm here for an angiogram" HPI: Kelli ShadowJuliette Flores is an 63 y.o. female with prior history of (L) dural AV fistula with prior obliteration in 2011. She has had small residual fistula on prior follow up angiograms and MRA. She reports minimal symptoms of occasional 'ringing' on her left side. She is otherwise doing well, reports her BP is normally under good control. She is here today for another cerebral arteriogram. She took her contrast allergy pre-meds last night, but has not yet taken this morning's dose. PMHx and meds reviewed.  Past Medical History:  Past Medical History  Diagnosis Date  . Hypertension     Past Surgical History: History reviewed. No pertinent past surgical history.  Family History: No family history on file.  Social History:  reports that she has never smoked. She does not have any smokeless tobacco history on file. She reports that she does not drink alcohol. Her drug history is not on file.  Allergies:  Allergies  Allergen Reactions  . Shellfish Allergy Hives  . Codeine Rash    Medications:   Medication List    ASK your doctor about these medications       aspirin EC 81 MG tablet  Take 81 mg by mouth every evening.     atenolol-chlorthalidone 50-25 MG per tablet  Commonly known as:  TENORETIC  Take 0.5 tablets by mouth every morning.     CALCIUM PO  Take 1,000 mg by mouth every morning.     cholecalciferol 1000 UNITS tablet  Commonly known as:  VITAMIN D  Take 1,000 Units by mouth every morning.        Please HPI for pertinent positives, otherwise complete 10 system ROS negative.  Physical Exam: BP 148/93  Pulse 76  Temp(Src) 97.5 F (36.4 C) (Oral)  Resp 18  Ht 5' 4.5" (1.638 m)  Wt 152 lb (68.947 kg)  BMI 25.70 kg/m2  SpO2 100% Body mass index is 25.7 kg/(m^2).   General Appearance:  Alert, cooperative, no distress, appears stated age  Head:  Normocephalic, without obvious abnormality, atraumatic  ENT:  Unremarkable  Neck: Supple, symmetrical, trachea midline  Lungs:   Clear to auscultation bilaterally, no w/r/r, respirations unlabored without use of accessory muscles.  Chest Wall:  No tenderness or deformity  Heart:  Regular rate and rhythm, S1, S2 normal, no murmur, rub or gallop.  Abdomen:   Soft, non-tender, non distended.  Extremities: Extremities normal, atraumatic, no cyanosis or edema  Pulses:  femoral  Neurologic: Normal affect, no gross deficits.   No results found for this or any previous visit (from the past 48 hour(s)). No results found.  Assessment/Plan Hx of (L)dural AV fistula with prior obliteration For cerebral arteriogram today. Discussed the procedure, risks, complications, use of sedation. Labs pending Consent signed in chart  Brayton ElKevin Myrth Dahan PA-C 06/23/2013, 8:15 AM

## 2013-06-23 NOTE — Discharge Instructions (Signed)
Angiography, Care After ° °Refer to this sheet in the next few weeks. These instructions provide you with information on caring for yourself after your procedure. Your health care provider may also give you more specific instructions. Your treatment has been planned according to current medical practices, but problems sometimes occur. Call your health care provider if you have any problems or questions after your procedure.  °WHAT TO EXPECT AFTER THE PROCEDURE °After your procedure, it is typical to have the following sensations: °· Minor discomfort or tenderness and a small bump at the catheter insertion site. The bump should usually decrease in size and tenderness within 1 to 2 weeks. °· Any bruising will usually fade within 2 to 4 weeks. °HOME CARE INSTRUCTIONS  °· You may need to keep taking blood thinners if they were prescribed for you. Only take over-the-counter or prescription medicines for pain, fever, or discomfort as directed by your health care provider. °· Do not apply powder or lotion to the site. °· Do not sit in a bathtub, swimming pool, or whirlpool for 5 to 7 days. °· You may shower 24 hours after the procedure. Remove the bandage (dressing) and gently wash the site with plain soap and water. Gently pat the site dry. °· Inspect the site at least twice daily. °· Limit your activity for the first 24 hours. Do not bend, squat, or lift anything over 10 lb (9 kg) or as directed by your health care provider. °· Do not drive home if you are discharged the day of the procedure. Have someone else drive you. Follow instructions about when you can drive or return to work. °SEEK MEDICAL CARE IF: °· You get lightheaded when standing up. °· You have drainage (other than a small amount of blood on the dressing). °· You have chills. °· You have a fever. °· You have redness, warmth, swelling, or pain at the insertion site. °SEEK IMMEDIATE MEDICAL CARE IF:  °· You develop chest pain or shortness of breath, feel  faint, or pass out. °· You have bleeding, swelling larger than a walnut, or drainage from the catheter insertion site. °· You develop pain, discoloration, coldness, or severe bruising in the leg or arm that held the catheter. °· You have heavy bleeding from the site. If this happens, hold pressure on the site. °MAKE SURE YOU: °· Understand these instructions. °· Will watch your condition. °· Will get help right away if you are not doing well or get worse. °Document Released: 08/28/2004 Document Revised: 10/12/2012 Document Reviewed: 07/04/2012 °[employer]® Patient Information ©2014 [employer]. ° °

## 2013-10-16 ENCOUNTER — Other Ambulatory Visit (HOSPITAL_COMMUNITY): Payer: Self-pay | Admitting: Family Medicine

## 2013-10-16 DIAGNOSIS — Z1231 Encounter for screening mammogram for malignant neoplasm of breast: Secondary | ICD-10-CM

## 2013-11-30 ENCOUNTER — Ambulatory Visit (HOSPITAL_COMMUNITY)
Admission: RE | Admit: 2013-11-30 | Discharge: 2013-11-30 | Disposition: A | Discharge status: Home / Self Care | Payer: BC Managed Care – PPO | Source: Ambulatory Visit | Attending: Family Medicine | Admitting: Family Medicine

## 2013-11-30 DIAGNOSIS — Z1231 Encounter for screening mammogram for malignant neoplasm of breast: Secondary | ICD-10-CM | POA: Insufficient documentation

## 2014-05-08 ENCOUNTER — Telehealth (HOSPITAL_COMMUNITY): Payer: Self-pay | Admitting: Interventional Radiology

## 2014-05-08 NOTE — Telephone Encounter (Signed)
Called pt and told her that her insurance company continues to deny our request for a follow-up MRI/MRA scan. I told her that Deveshwar wants to proceed with a cerebral angiogram for follow-up instead as that does not require authorization with her insurance company. Pt states she would like to call her insurance company first to see if she can get them to cover the MRI/MRA. If she is unable to do so she wants to call Abelardo Dieselrudy Wade's office to see if she can get help from them. She states she will call me back and let me know. JM

## 2014-06-06 ENCOUNTER — Other Ambulatory Visit (HOSPITAL_COMMUNITY): Payer: Self-pay | Admitting: Interventional Radiology

## 2014-06-06 ENCOUNTER — Telehealth (HOSPITAL_COMMUNITY): Payer: Self-pay | Admitting: Interventional Radiology

## 2014-06-06 DIAGNOSIS — I770 Arteriovenous fistula, acquired: Secondary | ICD-10-CM

## 2014-06-06 NOTE — Telephone Encounter (Signed)
Called pt, left VM for her to call to schedule f/u JM

## 2014-06-22 ENCOUNTER — Telehealth (HOSPITAL_COMMUNITY): Payer: Self-pay

## 2014-06-22 NOTE — Telephone Encounter (Signed)
Called pt, left message for pt to call back to schedule Angio. AW

## 2014-07-17 ENCOUNTER — Telehealth (HOSPITAL_COMMUNITY): Payer: Self-pay | Admitting: Interventional Radiology

## 2014-07-17 NOTE — Telephone Encounter (Signed)
Called pt, left VM for her to call me back to schedule her f/u exam. JM

## 2014-11-28 ENCOUNTER — Other Ambulatory Visit: Payer: Self-pay

## 2014-11-28 DIAGNOSIS — Z1231 Encounter for screening mammogram for malignant neoplasm of breast: Secondary | ICD-10-CM

## 2014-12-04 ENCOUNTER — Ambulatory Visit
Admission: RE | Admit: 2014-12-04 | Discharge: 2014-12-04 | Disposition: A | Discharge status: Home / Self Care | Payer: BC Managed Care – PPO | Source: Ambulatory Visit

## 2014-12-04 DIAGNOSIS — Z1231 Encounter for screening mammogram for malignant neoplasm of breast: Secondary | ICD-10-CM

## 2014-12-11 ENCOUNTER — Other Ambulatory Visit (HOSPITAL_COMMUNITY): Payer: Self-pay | Admitting: Interventional Radiology

## 2014-12-11 ENCOUNTER — Telehealth (HOSPITAL_COMMUNITY): Payer: Self-pay | Admitting: *Deleted

## 2014-12-11 DIAGNOSIS — I770 Arteriovenous fistula, acquired: Secondary | ICD-10-CM

## 2014-12-11 NOTE — Telephone Encounter (Signed)
Called and left message at [facility] for Prednisone 50mg  po 13 hrs, 7hrs and 1 hr prior to radiologic procedure and benadryl 50mg  po 1 hr prior to radiologic procedure.  Left call back number for pharmacy

## 2014-12-18 ENCOUNTER — Telehealth (HOSPITAL_COMMUNITY): Payer: Self-pay | Admitting: Interventional Radiology

## 2014-12-18 NOTE — Telephone Encounter (Signed)
Pt called and left a message wanting to know why her insurance approved her MRI this time. I called her back, left a VM for her to call me back. JM

## 2014-12-20 ENCOUNTER — Other Ambulatory Visit (HOSPITAL_COMMUNITY)
Admission: RE | Admit: 2014-12-20 | Discharge: 2014-12-20 | Disposition: A | Discharge status: Home / Self Care | Payer: BC Managed Care – PPO | Source: Ambulatory Visit | Attending: Family Medicine | Admitting: Family Medicine

## 2014-12-20 ENCOUNTER — Other Ambulatory Visit: Payer: Self-pay | Admitting: Family Medicine

## 2014-12-20 DIAGNOSIS — Z1151 Encounter for screening for human papillomavirus (HPV): Secondary | ICD-10-CM | POA: Diagnosis not present

## 2014-12-20 DIAGNOSIS — Z124 Encounter for screening for malignant neoplasm of cervix: Secondary | ICD-10-CM | POA: Insufficient documentation

## 2014-12-24 LAB — CYTOLOGY - PAP

## 2014-12-25 ENCOUNTER — Telehealth (HOSPITAL_COMMUNITY): Payer: Self-pay | Admitting: *Deleted

## 2014-12-25 NOTE — Telephone Encounter (Signed)
Called and left message that she has appointment on 11/4 and that her [facility] contrast [facility] [address]

## 2014-12-28 ENCOUNTER — Ambulatory Visit (HOSPITAL_COMMUNITY)
Admission: RE | Admit: 2014-12-28 | Discharge: 2014-12-28 | Disposition: A | Discharge status: Home / Self Care | Payer: BC Managed Care – PPO | Source: Ambulatory Visit | Attending: Interventional Radiology | Admitting: Interventional Radiology

## 2014-12-28 ENCOUNTER — Ambulatory Visit (HOSPITAL_COMMUNITY): Admission: RE | Admit: 2014-12-28 | Payer: BC Managed Care – PPO | Source: Ambulatory Visit

## 2014-12-28 DIAGNOSIS — Z9889 Other specified postprocedural states: Secondary | ICD-10-CM | POA: Insufficient documentation

## 2014-12-28 DIAGNOSIS — Z09 Encounter for follow-up examination after completed treatment for conditions other than malignant neoplasm: Secondary | ICD-10-CM | POA: Insufficient documentation

## 2014-12-28 DIAGNOSIS — I770 Arteriovenous fistula, acquired: Secondary | ICD-10-CM | POA: Insufficient documentation

## 2014-12-28 DIAGNOSIS — Z01812 Encounter for preprocedural laboratory examination: Secondary | ICD-10-CM | POA: Insufficient documentation

## 2014-12-28 LAB — CREATININE, SERUM
Creatinine, Ser: 0.97 mg/dL (ref 0.44–1.00)
GFR calc Af Amer: >60 mL/min (ref >60)
GFR calc non Af Amer: >60 mL/min (ref >60)

## 2014-12-28 MED ORDER — GADOBENATE DIMEGLUMINE 529 MG/ML IV SOLN
15.0000 mL | Freq: Once | INTRAVENOUS | Status: AC
  Administered 2014-12-28: 14 mL via INTRAVENOUS

## 2015-01-03 ENCOUNTER — Encounter: Payer: Self-pay | Admitting: Family Medicine

## 2015-02-21 ENCOUNTER — Telehealth (HOSPITAL_COMMUNITY): Payer: Self-pay | Admitting: Interventional Radiology

## 2015-02-21 NOTE — Telephone Encounter (Signed)
Pt called and stated that she would not be able to come in for her follow-up cerebral angiogram until the middle of 2017 due to her financial situation. She stated that she would call us back when she is ready to schedule her appointment. JM

## 2015-03-29 ENCOUNTER — Other Ambulatory Visit: Payer: Self-pay | Admitting: Family Medicine

## 2015-03-29 ENCOUNTER — Ambulatory Visit
Admission: RE | Admit: 2015-03-29 | Discharge: 2015-03-29 | Disposition: A | Discharge status: Home / Self Care | Payer: BC Managed Care – PPO | Source: Ambulatory Visit | Attending: Family Medicine | Admitting: Family Medicine

## 2015-03-29 DIAGNOSIS — R519 Headache, unspecified: Secondary | ICD-10-CM

## 2015-03-29 DIAGNOSIS — R51 Headache: Principal | ICD-10-CM

## 2015-11-22 ENCOUNTER — Other Ambulatory Visit: Payer: Self-pay | Admitting: Family Medicine

## 2015-11-22 DIAGNOSIS — Z1231 Encounter for screening mammogram for malignant neoplasm of breast: Secondary | ICD-10-CM

## 2015-12-05 ENCOUNTER — Ambulatory Visit: Payer: BC Managed Care – PPO

## 2015-12-05 ENCOUNTER — Ambulatory Visit
Admission: RE | Admit: 2015-12-05 | Discharge: 2015-12-05 | Disposition: A | Discharge status: Home / Self Care | Payer: BC Managed Care – PPO | Source: Ambulatory Visit | Attending: Family Medicine | Admitting: Family Medicine

## 2015-12-05 DIAGNOSIS — Z1231 Encounter for screening mammogram for malignant neoplasm of breast: Secondary | ICD-10-CM

## 2016-02-11 ENCOUNTER — Other Ambulatory Visit: Payer: Self-pay | Admitting: Radiology

## 2016-02-11 ENCOUNTER — Other Ambulatory Visit (HOSPITAL_COMMUNITY): Payer: Self-pay | Admitting: Interventional Radiology

## 2016-02-11 DIAGNOSIS — I671 Cerebral aneurysm, nonruptured: Secondary | ICD-10-CM

## 2016-02-12 ENCOUNTER — Other Ambulatory Visit: Payer: Self-pay | Admitting: Radiology

## 2016-02-14 ENCOUNTER — Ambulatory Visit (HOSPITAL_COMMUNITY)
Admission: RE | Admit: 2016-02-14 | Discharge: 2016-02-14 | Disposition: A | Discharge status: Home / Self Care | Payer: BC Managed Care – PPO | Source: Ambulatory Visit | Attending: Interventional Radiology | Admitting: Interventional Radiology

## 2016-02-14 ENCOUNTER — Other Ambulatory Visit (HOSPITAL_COMMUNITY): Payer: Self-pay | Admitting: Interventional Radiology

## 2016-02-14 DIAGNOSIS — H93A2 Pulsatile tinnitus, left ear: Secondary | ICD-10-CM | POA: Insufficient documentation

## 2016-02-14 DIAGNOSIS — I671 Cerebral aneurysm, nonruptured: Secondary | ICD-10-CM | POA: Diagnosis not present

## 2016-02-14 DIAGNOSIS — Z7982 Long term (current) use of aspirin: Secondary | ICD-10-CM | POA: Diagnosis not present

## 2016-02-14 DIAGNOSIS — Z885 Allergy status to narcotic agent status: Secondary | ICD-10-CM | POA: Insufficient documentation

## 2016-02-14 DIAGNOSIS — I10 Essential (primary) hypertension: Secondary | ICD-10-CM | POA: Diagnosis not present

## 2016-02-14 DIAGNOSIS — Z91013 Allergy to seafood: Secondary | ICD-10-CM | POA: Diagnosis not present

## 2016-02-14 HISTORY — PX: IR GENERIC HISTORICAL: IMG1180011

## 2016-02-14 LAB — PROTIME-INR
INR: 1.03
Prothrombin Time: 13.5 seconds (ref 11.4–15.2)

## 2016-02-14 LAB — BASIC METABOLIC PANEL
ANION GAP: 11 (ref 5–15)
BUN: 6 mg/dL (ref 6–20)
CHLORIDE: 96 mmol/L — AB (ref 101–111)
CO2: 23 mmol/L (ref 22–32)
Calcium: 9.9 mg/dL (ref 8.9–10.3)
Creatinine, Ser: 0.94 mg/dL (ref 0.44–1.00)
GFR calc Af Amer: >60 mL/min (ref >60)
GFR calc non Af Amer: >60 mL/min (ref >60)
GLUCOSE: 156 mg/dL — AB (ref 65–99)
POTASSIUM: 3.3 mmol/L — AB (ref 3.5–5.1)
Sodium: 130 mmol/L — ABNORMAL LOW (ref 135–145)

## 2016-02-14 LAB — CBC
HEMATOCRIT: 39 % (ref 36.0–46.0)
Hemoglobin: 14.2 g/dL (ref 12.0–15.0)
MCH: 32 pg (ref 26.0–34.0)
MCHC: 36.4 g/dL — ABNORMAL HIGH (ref 30.0–36.0)
MCV: 87.8 fL (ref 78.0–100.0)
Platelets: 249 10*3/uL (ref 150–400)
RBC: 4.44 MIL/uL (ref 3.87–5.11)
RDW: 11.4 % — ABNORMAL LOW (ref 11.5–15.5)
WBC: 3 10*3/uL — AB (ref 4.0–10.5)

## 2016-02-14 LAB — APTT: aPTT: 29 seconds (ref 24–36)

## 2016-02-14 MED ORDER — LIDOCAINE HCL (PF) 1 % IJ SOLN
INTRAMUSCULAR | Status: AC
  Filled 2016-02-14: qty 30

## 2016-02-14 MED ORDER — LIDOCAINE HCL 1 % IJ SOLN
INTRAMUSCULAR | Status: DC | PRN
  Administered 2016-02-14: 15 mL

## 2016-02-14 MED ORDER — HEPARIN SODIUM (PORCINE) 1000 UNIT/ML IJ SOLN
INTRAMUSCULAR | Status: AC
  Filled 2016-02-14: qty 2

## 2016-02-14 MED ORDER — FENTANYL CITRATE (PF) 100 MCG/2ML IJ SOLN
INTRAMUSCULAR | Status: DC | PRN
  Administered 2016-02-14: 25 ug via INTRAVENOUS

## 2016-02-14 MED ORDER — IOPAMIDOL (ISOVUE-300) INJECTION 61%
INTRAVENOUS | Status: AC
  Administered 2016-02-14: 10 mL
  Filled 2016-02-14: qty 50

## 2016-02-14 MED ORDER — IOPAMIDOL (ISOVUE-300) INJECTION 61%
INTRAVENOUS | Status: AC
  Administered 2016-02-14: 75 mL
  Filled 2016-02-14: qty 150

## 2016-02-14 MED ORDER — SODIUM CHLORIDE 0.9 % IV SOLN: INTRAVENOUS | Status: AC

## 2016-02-14 MED ORDER — MIDAZOLAM HCL 2 MG/2ML IJ SOLN
INTRAMUSCULAR | Status: AC
  Filled 2016-02-14: qty 2

## 2016-02-14 MED ORDER — FENTANYL CITRATE (PF) 100 MCG/2ML IJ SOLN
INTRAMUSCULAR | Status: AC
  Filled 2016-02-14: qty 2

## 2016-02-14 MED ORDER — MIDAZOLAM HCL 2 MG/2ML IJ SOLN
INTRAMUSCULAR | Status: DC | PRN
  Administered 2016-02-14: 1 mg via INTRAVENOUS

## 2016-02-14 MED ORDER — HEPARIN SODIUM (PORCINE) 1000 UNIT/ML IJ SOLN
INTRAMUSCULAR | Status: DC | PRN
  Administered 2016-02-14: 1000 [IU] via INTRAVENOUS

## 2016-02-14 MED ORDER — SODIUM CHLORIDE 0.9 % IV SOLN
Freq: Once | INTRAVENOUS | Status: AC
  Administered 2016-02-14: 09:00:00 via INTRAVENOUS

## 2016-02-14 NOTE — H&P (Signed)
Referring Physician(s): Byers,J  Supervising Physician: Julieanne Cottoneveshwar, Sanjeev  [facility]:  [facility]  Chief Complaint:  Dural arteriovenous fistula  Subjective: Patient familiar to IR service from prior cerebral arteriograms. She has a history of left pulsatile tinnitus since 2011 with known left skull base dural AV fistula. She has undergone Onyx embolization of the fistula in April 2011 and April 2012. Latest MRI of the brain in 2016 revealed faint blush in the medial inferior left temporal bone, site of previous abnormal enhancement consistent with persistence, but improvement of left dural AV fistula. She presents again today for follow-up cerebral arteriogram to reassess fistula. She currently denies fever, headache, chest pain, dyspnea, cough, abdominal/back pain, nausea, vomiting or abnormal bleeding. She does continue to have left-sided tinnitus but pulsatile character is now gone. She denies any additional neurological complaints. Past Medical History:  Diagnosis Date  . Hypertension    No past surgical history on file.   Allergies: Shellfish allergy and Codeine  Medications: Prior to Admission medications   Medication Sig Start Date End Date Taking? Authorizing Provider  aspirin EC 81 MG tablet Take 81 mg by mouth every evening.   Yes Historical Provider, MD  atenolol-chlorthalidone (TENORETIC) 50-25 MG per tablet Take 0.5 tablets by mouth every morning.    Yes Historical Provider, MD  Calcium Carb-Cholecalciferol (CALCIUM 1000 + D PO) Take 1 tablet by mouth daily with supper.   Yes Historical Provider, MD  cholecalciferol (VITAMIN D) 1000 UNITS tablet Take 1,000 Units by mouth every morning.    Yes Historical Provider, MD  Multiple Vitamins-Minerals (MULTIVITAMIN WITH MINERALS) tablet Take 1 tablet by mouth daily.   Yes Historical Provider, MD  simvastatin (ZOCOR) 20 MG tablet Take 20 mg by mouth every evening.   Yes Historical Provider, MD     Vital Signs: BP (!)  145/86   Pulse 71   Temp 98.1 F (36.7 C)   Resp 18   Ht 5\' 4"  (1.626 m)   Wt 151 lb (68.5 kg)   SpO2 100%   BMI 25.92 kg/m   Physical Exam  patient awake, alert. Chest clear to auscultation bilaterally. Heart with regular rate and rhythm. Abdomen soft, positive bowel sounds, nontender. Lower extremities with no edema. Neuro nonfocal. Imaging: No results found.  Labs:  CBC: No results for input(s): WBC, HGB, HCT, PLT in the last 8760 hours.  COAGS: No results for input(s): INR, APTT in the last 8760 hours.  BMP: No results for input(s): NA, K, CL, CO2, GLUCOSE, BUN, CALCIUM, CREATININE, GFRNONAA, GFRAA in the last 8760 hours.  Invalid input(s): CMP  LIVER FUNCTION TESTS: No results for input(s): BILITOT, AST, ALT, ALKPHOS, PROT, ALBUMIN in the last 8760 hours.  Assessment and Plan: Patient with history of left-sided pulsatile tinnitus since 2011 and prior Onyx embolization of known left skull base dural AV fistula in 2011, 2012. Latest MRI brain in 2016 revealed faint blush in the medial/ inferior left temporal bone, site of previous abnormal enhancement consistent with persistence, but improvement of, left dural AV fistula. She presents again today for follow-up cerebral arteriogram to reassess fistula. Details/risks of procedure, including but not limited to, internal bleeding, infection, TIA/stroke, contrast nephropathy, discussed with patient with her understanding and consent. She has been premedicated for known contrast allergy.LABS PENDING.    Electronically Signed: D. Jeananne RamaKevin Allred 02/14/2016, 8:23 AM   I spent a total of 20 minutes at the the patient's bedside AND on the patient's hospital floor or unit, greater than  50% of which was counseling/coordinating care for cerebral arteriogram

## 2016-02-14 NOTE — Procedures (Signed)
S/P 4 vessel cerebral arteriogram. RT CFA approach. Findings. 1..Residual fistula less prominent in the lt sub occipital region again suplies by x2 distal branches of the Lt [place] and Lt ascending pharyngeal  posterior  Branch..No intracranial communication noted intracranially

## 2016-02-14 NOTE — Sedation Documentation (Signed)
ETC02 removed per Dr. Deveshwar  

## 2016-02-14 NOTE — Discharge Instructions (Signed)
Femoral Site Care °Introduction °Refer to this sheet in the next few weeks. These instructions provide you with information about caring for yourself after your procedure. Your health care provider may also give you more specific instructions. Your treatment has been planned according to current medical practices, but problems sometimes occur. Call your health care provider if you have any problems or questions after your procedure. °What can I expect after the procedure? °After your procedure, it is typical to have the following: °· Bruising at the site that usually fades within 1-2 weeks. °· Blood collecting in the tissue (hematoma) that may be painful to the touch. It should usually decrease in size and tenderness within 1-2 weeks. °Follow these instructions at home: °· Take medicines only as directed by your health care provider. °· You may shower 24-48 hours after the procedure or as directed by your health care provider. Remove the bandage (dressing) and gently wash the site with plain soap and water. Pat the area dry with a clean towel. Do not rub the site, because this may cause bleeding. °· Do not take baths, swim, or use a hot tub until your health care provider approves. °· Check your insertion site every day for redness, swelling, or drainage. °· Do not apply powder or lotion to the site. °· Limit use of stairs to twice a day for the first 2-3 days or as directed by your health care provider. °· Do not squat for the first 2-3 days or as directed by your health care provider. °· Do not lift over 10 lb (4.5 kg) for 5 days after your procedure or as directed by your health care provider. °· Ask your health care provider when it is okay to: °¨ Return to work or school. °¨ Resume usual physical activities or sports. °¨ Resume sexual activity. °· Do not drive home if you are discharged the same day as the procedure. Have someone else drive you. °· You may drive 24 hours after the procedure unless otherwise  instructed by your health care provider. °· Do not operate machinery or power tools for 24 hours after the procedure or as directed by your health care provider. °· If your procedure was done as an outpatient procedure, which means that you went home the same day as your procedure, a responsible adult should be with you for the first 24 hours after you arrive home. °· Keep all follow-up visits as directed by your health care provider. This is important. °Contact a health care provider if: °· You have a fever. °· You have chills. °· You have increased bleeding from the site. Hold pressure on the site. °Get help right away if: °· You have unusual pain at the site. °· You have redness, warmth, or swelling at the site. °· You have drainage (other than a small amount of blood on the dressing) from the site. °· The site is bleeding, and the bleeding does not stop after 30 minutes of holding steady pressure on the site. °· Your leg or foot becomes pale, cool, tingly, or numb. °This information is not intended to replace advice given to you by your health care provider. Make sure you discuss any questions you have with your health care provider. °Document Released: 10/13/2013 Document Revised: 07/18/2015 Document Reviewed: 08/29/2013 °© 2017 Elsevier ° °

## 2016-02-14 NOTE — Sedation Documentation (Signed)
5 Fr. Kelli Flores to right graoin

## 2016-02-19 ENCOUNTER — Encounter (HOSPITAL_COMMUNITY): Payer: Self-pay | Admitting: Interventional Radiology

## 2016-11-05 ENCOUNTER — Other Ambulatory Visit: Payer: Self-pay | Admitting: Family Medicine

## 2016-11-05 DIAGNOSIS — Z1231 Encounter for screening mammogram for malignant neoplasm of breast: Secondary | ICD-10-CM

## 2016-12-11 ENCOUNTER — Ambulatory Visit
Admission: RE | Admit: 2016-12-11 | Discharge: 2016-12-11 | Disposition: A | Discharge status: Home / Self Care | Payer: BC Managed Care – PPO | Source: Ambulatory Visit | Attending: Family Medicine | Admitting: Family Medicine

## 2016-12-11 DIAGNOSIS — Z1231 Encounter for screening mammogram for malignant neoplasm of breast: Secondary | ICD-10-CM

## 2017-02-02 ENCOUNTER — Telehealth (HOSPITAL_COMMUNITY): Payer: Self-pay

## 2017-02-02 NOTE — Telephone Encounter (Signed)
Called to schedule 1 yr f/u mri, left message for pt to return call. AW 

## 2017-02-03 ENCOUNTER — Other Ambulatory Visit (HOSPITAL_COMMUNITY): Payer: Self-pay | Admitting: Interventional Radiology

## 2017-02-03 DIAGNOSIS — I671 Cerebral aneurysm, nonruptured: Secondary | ICD-10-CM

## 2017-02-10 ENCOUNTER — Encounter: Payer: Self-pay | Admitting: Radiology

## 2017-02-10 NOTE — Progress Notes (Unsigned)
Patient ID: Kelli ShadowJuliette Flores, female   DOB: 02-May-1950, 66 y.o.   MRN: [id number]  Pt scheduled for MRI on 12/21  Called in contrast allergy pre-meds to pt pharmacy.  13 hr pre-med: Prednisone 50mg  q6h  x 3 Benadryl 50mg  X 1  Brayton ElKevin Haani Bakula PA-C Interventional Radiology 02/10/2017 2:17 PM\

## 2017-02-12 ENCOUNTER — Ambulatory Visit (HOSPITAL_COMMUNITY): Payer: BC Managed Care – PPO

## 2017-02-12 ENCOUNTER — Ambulatory Visit (HOSPITAL_COMMUNITY)
Admission: RE | Admit: 2017-02-12 | Discharge: 2017-02-12 | Disposition: A | Discharge status: Home / Self Care | Payer: BC Managed Care – PPO | Source: Ambulatory Visit | Attending: Interventional Radiology | Admitting: Interventional Radiology

## 2017-02-12 DIAGNOSIS — Z01812 Encounter for preprocedural laboratory examination: Secondary | ICD-10-CM | POA: Insufficient documentation

## 2017-02-12 DIAGNOSIS — I671 Cerebral aneurysm, nonruptured: Secondary | ICD-10-CM | POA: Diagnosis present

## 2017-02-12 LAB — CREATININE, SERUM
CREATININE: 0.94 mg/dL (ref 0.44–1.00)
GFR calc Af Amer: >60 mL/min (ref >60)

## 2017-02-12 MED ORDER — GADOBENATE DIMEGLUMINE 529 MG/ML IV SOLN
18.0000 mL | Freq: Once | INTRAVENOUS | Status: AC | PRN
  Administered 2017-02-12: 18 mL via INTRAVENOUS

## 2017-03-12 ENCOUNTER — Telehealth (HOSPITAL_COMMUNITY): Payer: Self-pay

## 2017-03-12 NOTE — Telephone Encounter (Signed)
Called to schedule consult, left message for pt to return call. AW 

## 2017-03-17 ENCOUNTER — Telehealth (HOSPITAL_COMMUNITY): Payer: Self-pay

## 2017-03-17 NOTE — Telephone Encounter (Signed)
Called to schedule consult, left message for pt to return call. AW 

## 2017-03-18 ENCOUNTER — Other Ambulatory Visit (HOSPITAL_COMMUNITY): Payer: Self-pay | Admitting: Interventional Radiology

## 2017-03-18 DIAGNOSIS — I671 Cerebral aneurysm, nonruptured: Secondary | ICD-10-CM

## 2017-03-23 ENCOUNTER — Ambulatory Visit (HOSPITAL_COMMUNITY): Payer: BC Managed Care – PPO

## 2017-03-30 ENCOUNTER — Ambulatory Visit (HOSPITAL_COMMUNITY): Payer: BC Managed Care – PPO

## 2017-03-31 ENCOUNTER — Ambulatory Visit (HOSPITAL_COMMUNITY)
Admission: RE | Admit: 2017-03-31 | Discharge: 2017-03-31 | Disposition: A | Discharge status: Home / Self Care | Payer: BC Managed Care – PPO | Source: Ambulatory Visit | Attending: Interventional Radiology | Admitting: Interventional Radiology

## 2017-03-31 DIAGNOSIS — I671 Cerebral aneurysm, nonruptured: Secondary | ICD-10-CM

## 2017-03-31 HISTORY — PX: IR RADIOLOGIST EVAL & MGMT: IMG5224

## 2017-04-01 ENCOUNTER — Encounter (HOSPITAL_COMMUNITY): Payer: Self-pay | Admitting: Interventional Radiology

## 2017-07-05 ENCOUNTER — Other Ambulatory Visit: Payer: Self-pay | Admitting: Family Medicine

## 2017-10-12 ENCOUNTER — Other Ambulatory Visit: Payer: Self-pay | Admitting: Family Medicine

## 2017-10-12 DIAGNOSIS — E2839 Other primary ovarian failure: Secondary | ICD-10-CM

## 2017-12-01 ENCOUNTER — Ambulatory Visit
Admission: RE | Admit: 2017-12-01 | Discharge: 2017-12-01 | Disposition: A | Discharge status: Home / Self Care | Payer: BC Managed Care – PPO | Source: Ambulatory Visit | Attending: Family Medicine | Admitting: Family Medicine

## 2017-12-01 DIAGNOSIS — E2839 Other primary ovarian failure: Secondary | ICD-10-CM

## 2018-04-05 ENCOUNTER — Telehealth (HOSPITAL_COMMUNITY): Payer: Self-pay

## 2018-04-05 NOTE — Telephone Encounter (Signed)
Called to schedule f/u mri, no answer, left vm. AW 

## 2018-04-06 ENCOUNTER — Other Ambulatory Visit (HOSPITAL_COMMUNITY): Payer: Self-pay | Admitting: Interventional Radiology

## 2018-04-06 DIAGNOSIS — I770 Arteriovenous fistula, acquired: Secondary | ICD-10-CM

## 2018-04-11 ENCOUNTER — Telehealth (HOSPITAL_COMMUNITY): Payer: Self-pay

## 2018-04-11 NOTE — Telephone Encounter (Signed)
Returned pt's call to reschedule mri, no answer, left vm. AW

## 2018-04-21 ENCOUNTER — Encounter (HOSPITAL_COMMUNITY): Payer: Self-pay

## 2018-04-21 ENCOUNTER — Ambulatory Visit (HOSPITAL_COMMUNITY): Payer: BC Managed Care – PPO

## 2018-05-05 DIAGNOSIS — G5603 Carpal tunnel syndrome, bilateral upper limbs: Secondary | ICD-10-CM | POA: Diagnosis not present

## 2018-05-05 DIAGNOSIS — R202 Paresthesia of skin: Secondary | ICD-10-CM | POA: Diagnosis not present

## 2018-05-05 DIAGNOSIS — E785 Hyperlipidemia, unspecified: Secondary | ICD-10-CM | POA: Diagnosis not present

## 2018-05-05 DIAGNOSIS — I10 Essential (primary) hypertension: Secondary | ICD-10-CM | POA: Diagnosis not present

## 2018-05-11 DIAGNOSIS — G5603 Carpal tunnel syndrome, bilateral upper limbs: Secondary | ICD-10-CM | POA: Diagnosis not present

## 2018-07-13 DIAGNOSIS — Z9889 Other specified postprocedural states: Secondary | ICD-10-CM | POA: Diagnosis not present

## 2018-07-13 DIAGNOSIS — E7849 Other hyperlipidemia: Secondary | ICD-10-CM | POA: Diagnosis not present

## 2018-07-13 DIAGNOSIS — I10 Essential (primary) hypertension: Secondary | ICD-10-CM | POA: Diagnosis not present

## 2018-09-15 ENCOUNTER — Telehealth (HOSPITAL_COMMUNITY): Payer: Self-pay

## 2018-09-15 NOTE — Telephone Encounter (Signed)
Called pt to see if she was ready to schedule mri or if she wanted to hold off. Left message to return call. AW

## 2018-09-22 ENCOUNTER — Telehealth (HOSPITAL_COMMUNITY): Payer: Self-pay

## 2018-09-22 NOTE — Telephone Encounter (Signed)
Called to get pt's updated insurance, no answer, left vm. AW

## 2018-10-06 ENCOUNTER — Telehealth (HOSPITAL_COMMUNITY): Payer: Self-pay

## 2018-10-06 NOTE — Telephone Encounter (Signed)
Called to schedule mri, no answer, left vm. AW 

## 2018-11-07 ENCOUNTER — Ambulatory Visit (HOSPITAL_COMMUNITY): Admission: RE | Admit: 2018-11-07 | Payer: Medicare Other | Source: Ambulatory Visit

## 2018-11-07 ENCOUNTER — Ambulatory Visit (HOSPITAL_COMMUNITY)
Admission: RE | Admit: 2018-11-07 | Discharge: 2018-11-07 | Disposition: A | Discharge status: Home / Self Care | Payer: Medicare Other | Source: Ambulatory Visit | Attending: Interventional Radiology | Admitting: Interventional Radiology

## 2018-11-07 ENCOUNTER — Other Ambulatory Visit: Payer: Self-pay

## 2018-11-07 ENCOUNTER — Encounter (HOSPITAL_COMMUNITY): Payer: Self-pay

## 2018-11-07 DIAGNOSIS — I770 Arteriovenous fistula, acquired: Secondary | ICD-10-CM | POA: Diagnosis not present

## 2018-11-07 DIAGNOSIS — R9082 White matter disease, unspecified: Secondary | ICD-10-CM | POA: Insufficient documentation

## 2018-11-07 LAB — CREATININE, SERUM
Creatinine, Ser: 0.99 mg/dL (ref 0.44–1.00)
GFR calc Af Amer: >60 mL/min (ref >60)
GFR calc non Af Amer: 59 mL/min — ABNORMAL LOW (ref >60)

## 2018-11-07 MED ORDER — GADOBUTROL 1 MMOL/ML IV SOLN
7.0000 mL | Freq: Once | INTRAVENOUS | Status: AC | PRN
  Administered 2018-11-07: 7 mL via INTRAVENOUS

## 2018-11-09 ENCOUNTER — Other Ambulatory Visit: Payer: Self-pay | Admitting: Family Medicine

## 2018-11-09 ENCOUNTER — Other Ambulatory Visit (HOSPITAL_COMMUNITY): Payer: Self-pay | Admitting: Interventional Radiology

## 2018-11-09 ENCOUNTER — Telehealth: Payer: Self-pay | Admitting: Student

## 2018-11-09 DIAGNOSIS — Z1231 Encounter for screening mammogram for malignant neoplasm of breast: Secondary | ICD-10-CM

## 2018-11-09 DIAGNOSIS — I770 Arteriovenous fistula, acquired: Secondary | ICD-10-CM

## 2018-11-09 NOTE — Telephone Encounter (Signed)
NIR.  Patient is scheduled for an image-guided diagnostic cerebral arteriogram tentatively for 11/17/2018 with Dr. Estanislado Pandy. Patient with known shellfish allergy causing hives, she is always pre-medicated for iodinated diagnostic agents.  [facility] in [place], [place] [phone]) at [phone] to fill prescriptions: 1- Prednisone 50 mg tablets; take one tablet by mouth 13 hours, 7 hours, and 1 hour prior to procedure 11/17/2018. 2- Benadryl 50 mg tablets; take one tablet by mouth 1 hour prior to procedure 11/17/2018.   Bea Graff Tamirah George, PA-C 11/09/2018, 2:59 PM

## 2018-11-16 ENCOUNTER — Other Ambulatory Visit: Payer: Self-pay | Admitting: Radiology

## 2018-11-16 ENCOUNTER — Other Ambulatory Visit: Payer: Self-pay | Admitting: Physician Assistant

## 2018-11-17 ENCOUNTER — Other Ambulatory Visit: Payer: Self-pay

## 2018-11-17 ENCOUNTER — Other Ambulatory Visit (HOSPITAL_COMMUNITY): Payer: Self-pay | Admitting: Interventional Radiology

## 2018-11-17 ENCOUNTER — Ambulatory Visit (HOSPITAL_COMMUNITY)
Admission: RE | Admit: 2018-11-17 | Discharge: 2018-11-17 | Disposition: A | Discharge status: Home / Self Care | Payer: Medicare Other | Source: Ambulatory Visit | Attending: Interventional Radiology | Admitting: Interventional Radiology

## 2018-11-17 DIAGNOSIS — I770 Arteriovenous fistula, acquired: Secondary | ICD-10-CM | POA: Insufficient documentation

## 2018-11-17 DIAGNOSIS — Z7982 Long term (current) use of aspirin: Secondary | ICD-10-CM | POA: Insufficient documentation

## 2018-11-17 DIAGNOSIS — Z79899 Other long term (current) drug therapy: Secondary | ICD-10-CM | POA: Diagnosis not present

## 2018-11-17 DIAGNOSIS — Z885 Allergy status to narcotic agent status: Secondary | ICD-10-CM | POA: Insufficient documentation

## 2018-11-17 DIAGNOSIS — Z91041 Radiographic dye allergy status: Secondary | ICD-10-CM | POA: Diagnosis not present

## 2018-11-17 DIAGNOSIS — Z91013 Allergy to seafood: Secondary | ICD-10-CM | POA: Diagnosis not present

## 2018-11-17 DIAGNOSIS — I10 Essential (primary) hypertension: Secondary | ICD-10-CM | POA: Insufficient documentation

## 2018-11-17 HISTORY — PX: IR ANGIO INTRA EXTRACRAN SEL COM CAROTID INNOMINATE UNI L MOD SED: IMG5358

## 2018-11-17 HISTORY — PX: IR ANGIO VERTEBRAL SEL VERTEBRAL UNI L MOD SED: IMG5367

## 2018-11-17 LAB — URINALYSIS, COMPLETE (UACMP) WITH MICROSCOPIC
Bilirubin Urine: NEGATIVE
Glucose, UA: NEGATIVE mg/dL
Hgb urine dipstick: NEGATIVE
Ketones, ur: NEGATIVE mg/dL
Leukocytes,Ua: NEGATIVE
Nitrite: NEGATIVE
Protein, ur: NEGATIVE mg/dL
Specific Gravity, Urine: 1.01 (ref 1.005–1.030)
pH: 8 (ref 5.0–8.0)

## 2018-11-17 LAB — CBC
HCT: 39.5 % (ref 36.0–46.0)
Hemoglobin: 13.4 g/dL (ref 12.0–15.0)
MCH: 32.1 pg (ref 26.0–34.0)
MCHC: 33.9 g/dL (ref 30.0–36.0)
MCV: 94.7 fL (ref 80.0–100.0)
Platelets: 271 10*3/uL (ref 150–400)
RBC: 4.17 MIL/uL (ref 3.87–5.11)
RDW: 11.9 % (ref 11.5–15.5)
WBC: 3.6 10*3/uL — ABNORMAL LOW (ref 4.0–10.5)
nRBC: 0 % (ref 0.0–0.2)

## 2018-11-17 LAB — BASIC METABOLIC PANEL
Anion gap: 11 (ref 5–15)
BUN: 8 mg/dL (ref 8–23)
CO2: 24 mmol/L (ref 22–32)
Calcium: 9.6 mg/dL (ref 8.9–10.3)
Chloride: 102 mmol/L (ref 98–111)
Creatinine, Ser: 0.94 mg/dL (ref 0.44–1.00)
GFR calc Af Amer: >60 mL/min (ref >60)
GFR calc non Af Amer: >60 mL/min (ref >60)
Glucose, Bld: 139 mg/dL — ABNORMAL HIGH (ref 70–99)
Potassium: 3.6 mmol/L (ref 3.5–5.1)
Sodium: 137 mmol/L (ref 135–145)

## 2018-11-17 LAB — PROTIME-INR
INR: 1.1 (ref 0.8–1.2)
Prothrombin Time: 13.6 seconds (ref 11.4–15.2)

## 2018-11-17 MED ORDER — FENTANYL CITRATE (PF) 100 MCG/2ML IJ SOLN
INTRAMUSCULAR | Status: AC | PRN
  Administered 2018-11-17: 25 ug via INTRAVENOUS

## 2018-11-17 MED ORDER — IOHEXOL 300 MG/ML  SOLN
150.0000 mL | Freq: Once | INTRAMUSCULAR | Status: AC | PRN
  Administered 2018-11-17: 48 mL via INTRA_ARTERIAL

## 2018-11-17 MED ORDER — HEPARIN SODIUM (PORCINE) 1000 UNIT/ML IJ SOLN
INTRAMUSCULAR | Status: AC | PRN
  Administered 2018-11-17: 1000 [IU] via INTRAVENOUS

## 2018-11-17 MED ORDER — FENTANYL CITRATE (PF) 100 MCG/2ML IJ SOLN
INTRAMUSCULAR | Status: AC
  Filled 2018-11-17: qty 2

## 2018-11-17 MED ORDER — SODIUM CHLORIDE 0.9 % IV SOLN
INTRAVENOUS | Status: DC
  Administered 2018-11-17: 09:00:00 via INTRAVENOUS

## 2018-11-17 MED ORDER — HEPARIN SODIUM (PORCINE) 1000 UNIT/ML IJ SOLN
INTRAMUSCULAR | Status: AC
  Filled 2018-11-17: qty 1

## 2018-11-17 MED ORDER — LIDOCAINE HCL (PF) 1 % IJ SOLN
INTRAMUSCULAR | Status: AC | PRN
  Administered 2018-11-17: 10 mL

## 2018-11-17 MED ORDER — MIDAZOLAM HCL 2 MG/2ML IJ SOLN
INTRAMUSCULAR | Status: AC
  Filled 2018-11-17: qty 2

## 2018-11-17 MED ORDER — LIDOCAINE HCL 1 % IJ SOLN
INTRAMUSCULAR | Status: AC
  Filled 2018-11-17: qty 20

## 2018-11-17 MED ORDER — SODIUM CHLORIDE 0.9 % IV SOLN: INTRAVENOUS | Status: AC

## 2018-11-17 MED ORDER — MIDAZOLAM HCL 2 MG/2ML IJ SOLN
INTRAMUSCULAR | Status: AC | PRN
  Administered 2018-11-17: 1 mg via INTRAVENOUS

## 2018-11-17 NOTE — Discharge Instructions (Addendum)
Femoral Site Care °This sheet gives you information about how to care for yourself after your procedure. Your health care provider may also give you more specific instructions. If you have problems or questions, contact your health care provider. °What can I expect after the procedure? °After the procedure, it is common to have: °· Bruising that usually fades within 1-2 weeks. °· Tenderness at the site. °Follow these instructions at home: °Wound care °· Follow instructions from your health care provider about how to take care of your insertion site. Make sure you: °? Wash your hands with soap and water before you change your bandage (dressing). If soap and water are not available, use hand sanitizer. °? Change your dressing as told by your health care provider. °? Leave stitches (sutures), skin glue, or adhesive strips in place. These skin closures may need to stay in place for 2 weeks or longer. If adhesive strip edges start to loosen and curl up, you may trim the loose edges. Do not remove adhesive strips completely unless your health care provider tells you to do that. °· Do not take baths, swim, or use a hot tub until your health care provider approves. °· You may shower 24-48 hours after the procedure or as told by your health care provider. °? Gently wash the site with plain soap and water. °? Pat the area dry with a clean towel. °? Do not rub the site. This may cause bleeding. °· Do not apply powder or lotion to the site. Keep the site clean and dry. °· Check your femoral site every day for signs of infection. Check for: °? Redness, swelling, or pain. °? Fluid or blood. °? Warmth. °? Pus or a bad smell. °Activity °· For the first 2-3 days after your procedure, or as long as directed: °? Avoid climbing stairs as much as possible. °? Do not squat. °· Do not lift anything that is heavier than 10 lb (4.5 kg), or the limit that you are told, until your health care provider says that it is safe. °· Rest as  directed. °? Avoid sitting for a long time without moving. Get up to take short walks every 1-2 hours. °· Do not drive for 24 hours if you were given a medicine to help you relax (sedative). °General instructions °· Take over-the-counter and prescription medicines only as told by your health care provider. °· Keep all follow-up visits as told by your health care provider. This is important. °Contact a health care provider if you have: °· A fever or chills. °· You have redness, swelling, or pain around your insertion site. °Get help right away if: °· The catheter insertion area swells very fast. °· You pass out. °· You suddenly start to sweat or your skin gets clammy. °· The catheter insertion area is bleeding, and the bleeding does not stop when you hold steady pressure on the area. °· The area near or just beyond the catheter insertion site becomes pale, cool, tingly, or numb. °These symptoms may represent a serious problem that is an emergency. Do not wait to see if the symptoms will go away. Get medical help right away. Call your local emergency services (911 in the [place] Do not drive yourself to the hospital. °Summary °· After the procedure, it is common to have bruising that usually fades within 1-2 weeks. °· Check your femoral site every day for signs of infection. °· Do not lift anything that is heavier than 10 lb (4.5 kg), or the   limit that you are told, until your health care provider says that it is safe. °This information is not intended to replace advice given to you by your health care provider. Make sure you discuss any questions you have with your health care provider. °Document Released: 10/13/2013 Document Revised: 02/22/2017 Document Reviewed: 02/22/2017 °Elsevier Patient Education © 2020 [employer] ° °

## 2018-11-17 NOTE — Procedures (Signed)
S/P Lt CCA and LT VA angiograms. RT CFA approach. Findings. 1.Persistence Lt Occipital AV fistula supplied by LT LA via musculoskeletal branches at C2 and ascending cervical branch of the Lt thyrocervical trunk and by post branch of the Lt ascending pharyngeal artery with  drainage into the Lt IJV. S.Stephanne Greeley MD

## 2018-11-17 NOTE — H&P (Signed)
Chief Complaint: Patient was seen in consultation today for cerebral arteriogram.  Supervising Physician: Julieanne Cotton  Patient Status: [facility] - Out-pt  History of Present Illness: Kelli Flores is a 68 y.o. 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 She is here for follow up formal cerebral angiogram. PMHx, meds, labs, imaging, allergies reviewed. She took her contrast allergy pre-meds as directed including final doses this am in Short Stay dept. Feels well, no recent fevers, chills, illness. Has been NPO today as directed.   Past Medical History:  Diagnosis Date   Hypertension     Past Surgical History:  Procedure Laterality Date   IR GENERIC HISTORICAL  02/14/2016   IR ANGIO INTRA EXTRACRAN SEL COM CAROTID INNOMINATE BILAT MOD SED 02/14/2016 Julieanne Cotton, MD [facility]   IR GENERIC HISTORICAL  02/14/2016   IR ANGIO VERTEBRAL SEL VERTEBRAL BILAT MOD SED 02/14/2016 Julieanne Cotton, MD [facility]   IR RADIOLOGIST EVAL & MGMT  03/31/2017    Allergies: Shellfish allergy, Contrast media [iodinated diagnostic agents], and Codeine  Medications: Prior to Admission medications   Medication Sig Start Date End Date Taking? Authorizing Provider  aspirin EC 81 MG tablet Take 81 mg by mouth every evening.   Yes [provider]  atenolol-chlorthalidone (TENORETIC) 50-25 MG per tablet Take 0.5 tablets by mouth every morning.    Yes [provider]  Calcium Carb-Cholecalciferol (CALCIUM 1000 + D) 1000-800 MG-UNIT TABS Take 1 tablet by mouth daily with supper. Only take 5 days out of the week   Yes [provider]  Cholecalciferol (VITAMIN D) 125 MCG (5000 UT) CAPS Take 1,000 Units by mouth daily. 5 days out of the week   Yes [provider]  Multiple Vitamins-Minerals (MULTIVITAMIN WITH MINERALS) tablet Take 1 tablet by mouth daily.   Yes [provider]  simvastatin (ZOCOR) 40 MG tablet Take 40 mg by mouth every evening.    Yes [provider]     No family history on file.  Social History   Socioeconomic History   Marital status: Divorced    Spouse name: Not on file   Number of children: Not on file   Years of education: Not on file   Highest education level: Not on file  Occupational History   Not on file  Social Needs   Financial resource strain: Not on file   Food insecurity    Worry: Not on file    Inability: Not on file   Transportation needs    Medical: Not on file    Non-medical: Not on file  Tobacco Use   Smoking status: Never Smoker  Substance and Sexual Activity   Alcohol use: No   Drug use: Not on file   Sexual activity: Not on file  Lifestyle   Physical activity    Days per week: Not on file    Minutes per session: Not on file   Stress: Not on file  Relationships   Social connections    Talks on phone: Not on file    Gets together: Not on file    Attends religious service: Not on file    Active member of club or organization: Not on file    Attends meetings of clubs or organizations: Not on file    Relationship status: Not on file  Other Topics Concern   Not on file  Social History Narrative  Not on file    ECOG Status: 0 - Asymptomatic  Review of Systems: A 12 point ROS discussed and pertinent positives are indicated in the HPI above.  All other systems are negative.  Review of Systems  Vital Signs: BP (!) 154/98    Pulse 69    Temp (!) 97.3 F (36.3 C) (Skin)    Resp 16    Ht 5' 4.5" (1.638 m)    Wt 70.3 kg    SpO2 100%    BMI 26.19 kg/m   Physical Exam Constitutional:      Appearance: Normal appearance.  HENT:     Head: Normocephalic.     Mouth/Throat:     Mouth: Mucous membranes are moist.     Pharynx: Oropharynx is clear.  Cardiovascular:     Rate and Rhythm: Normal rate and regular rhythm.     Pulses: Normal pulses.     Heart sounds: Normal heart  sounds.  Pulmonary:     Effort: Pulmonary effort is normal. No respiratory distress.     Breath sounds: Normal breath sounds.  Skin:    General: Skin is warm and dry.  Neurological:     General: No focal deficit present.     Mental Status: She is alert and oriented to person, place, and time.  Psychiatric:        Mood and Affect: Mood normal.        Judgment: Judgment normal.     Imaging: Mr Shirlee LatchMra Head Wo Contrast  Result Date: 11/07/2018 CLINICAL DATA:  Follow-up of AV fistula. EXAM: MRI HEAD WITHOUT AND WITH CONTRAST MRA HEAD WITHOUT CONTRAST TECHNIQUE: Multiplanar, multiecho pulse sequences of the brain and surrounding structures were obtained without and with intravenous contrast. Angiographic images of the head were obtained using MRA technique without contrast. CONTRAST:  7mL GADAVIST GADOBUTROL 1 MMOL/ML IV SOLN COMPARISON:  MR head without and with contrast and MRA head without contrast 02/12/2017 FINDINGS: MRI HEAD FINDINGS Brain: Mild periventricular white matter changes bilaterally are stable. No acute infarct, hemorrhage, or mass lesion is present. Focus of susceptibility in the left parietal lobe is chronic, likely related to underlying cavernous hemangioma or remote hemorrhage. Fistula along the left petrous apex is again noted. The ventricles are of normal size. No significant extraaxial fluid collection is present. The brainstem and cerebellum are within normal limits. Postcontrast images demonstrate no pathologic enhancement. Vascular: Flow is present in the major intracranial arteries. Skull and upper cervical spine: The craniocervical junction is normal. Upper cervical spine is within normal limits. Marrow signal is unremarkable. Sinuses/Orbits: The paranasal sinuses and mastoid air cells are clear. Bilateral lens replacements are noted. Globes and orbits are otherwise unremarkable. MRA HEAD FINDINGS Left skull base dural AV fistula is again noted. There is contribution from the  left PICA territory. Signal and enhancement are again noted in the left petrous apex extending to the left IJ bulb. This is stable in size. No aneurysm is associated. The internal carotid arteries are within normal limits from the high cervical segments through the ICA termini bilaterally. The A1 and M1 segments are normal. Right A1 segment is dominant. The anterior communicating artery is patent. MCA bifurcations are intact. The ACA and MCA branch vessels are normal. The vertebral arteries are codominant. Communication to the left AV fistula is noted from the left vertebral artery. Vertebrobasilar junction is normal. Basilar artery is normal. Both posterior cerebral arteries originate from the basilar tip. There is contribution from posterior communicating arteries bilaterally.  PCA branch vessels are normal. IMPRESSION: 1. Stable appearance of AV fistula at the left skull base with extension into the left petrous apex and IJ bulb. 2. No adjacent parenchymal changes. 3. Stable mild atrophy and white matter disease. 4. Focal susceptibility in the left parietal lobe is remote, likely reflecting prior hemorrhage or cavernous hemangioma. Electronically Signed   By: San Morelle M.D.   On: 11/07/2018 09:51   Mr Jeri Cos YS Contrast  Result Date: 11/07/2018 CLINICAL DATA:  Follow-up of AV fistula. EXAM: MRI HEAD WITHOUT AND WITH CONTRAST MRA HEAD WITHOUT CONTRAST TECHNIQUE: Multiplanar, multiecho pulse sequences of the brain and surrounding structures were obtained without and with intravenous contrast. Angiographic images of the head were obtained using MRA technique without contrast. CONTRAST:  68mL GADAVIST GADOBUTROL 1 MMOL/ML IV SOLN COMPARISON:  MR head without and with contrast and MRA head without contrast 02/12/2017 FINDINGS: MRI HEAD FINDINGS Brain: Mild periventricular white matter changes bilaterally are stable. No acute infarct, hemorrhage, or mass lesion is present. Focus of susceptibility in the  left parietal lobe is chronic, likely related to underlying cavernous hemangioma or remote hemorrhage. Fistula along the left petrous apex is again noted. The ventricles are of normal size. No significant extraaxial fluid collection is present. The brainstem and cerebellum are within normal limits. Postcontrast images demonstrate no pathologic enhancement. Vascular: Flow is present in the major intracranial arteries. Skull and upper cervical spine: The craniocervical junction is normal. Upper cervical spine is within normal limits. Marrow signal is unremarkable. Sinuses/Orbits: The paranasal sinuses and mastoid air cells are clear. Bilateral lens replacements are noted. Globes and orbits are otherwise unremarkable. MRA HEAD FINDINGS Left skull base dural AV fistula is again noted. There is contribution from the left PICA territory. Signal and enhancement are again noted in the left petrous apex extending to the left IJ bulb. This is stable in size. No aneurysm is associated. The internal carotid arteries are within normal limits from the high cervical segments through the ICA termini bilaterally. The A1 and M1 segments are normal. Right A1 segment is dominant. The anterior communicating artery is patent. MCA bifurcations are intact. The ACA and MCA branch vessels are normal. The vertebral arteries are codominant. Communication to the left AV fistula is noted from the left vertebral artery. Vertebrobasilar junction is normal. Basilar artery is normal. Both posterior cerebral arteries originate from the basilar tip. There is contribution from posterior communicating arteries bilaterally. PCA branch vessels are normal. IMPRESSION: 1. Stable appearance of AV fistula at the left skull base with extension into the left petrous apex and IJ bulb. 2. No adjacent parenchymal changes. 3. Stable mild atrophy and white matter disease. 4. Focal susceptibility in the left parietal lobe is remote, likely reflecting prior hemorrhage  or cavernous hemangioma. Electronically Signed   By: San Morelle M.D.   On: 11/07/2018 09:51    Labs:  CBC: No results for input(s): WBC, HGB, HCT, PLT in the last 8760 hours.  COAGS: No results for input(s): INR, APTT in the last 8760 hours.  BMP: Recent Labs    11/07/18 0735  CREATININE 0.99  GFRNONAA 59*  GFRAA >60    LIVER FUNCTION TESTS: No results for input(s): BILITOT, AST, ALT, ALKPHOS, PROT, ALBUMIN in the last 8760 hours.  TUMOR MARKERS: No results for input(s): AFPTM, CEA, CA199, CHROMGRNA in the last 8760 hours.  Assessment and Plan: Hx of AV fistula at the left skull base withextension into the left petrous apex and IJ bulb  For cerebral angio. Risks and benefits of cerebral angio were discussed with the patient including, but not limited to bleeding, infection, vascular injury or contrast induced renal failure.  This interventional procedure involves the use of X-rays and because of the nature of the planned procedure, it is possible that we will have prolonged use of X-ray fluoroscopy.  Potential radiation risks to you include (but are not limited to) the following: - A slightly elevated risk for cancer  several years later in life. This risk is typically less than 0.5% percent. This risk is low in comparison to the normal incidence of human cancer, which is 33% for women and 50% for men according to the [employer] Society. - Radiation induced injury can include skin redness, resembling a rash, tissue breakdown / ulcers and hair loss (which can be temporary or permanent).   The likelihood of either of these occurring depends on the difficulty of the procedure and whether you are sensitive to radiation due to previous procedures, disease, or genetic conditions.   IF your procedure requires a prolonged use of radiation, you will be notified and given written instructions for further action.  It is your responsibility to monitor the irradiated area  for the 2 weeks following the procedure and to notify your physician if you are concerned that you have suffered a radiation induced injury.    All of the patient's questions were answered, patient is agreeable to proceed.  Consent signed and in chart.   Thank you for this interesting consult.  I greatly enjoyed meeting Kelli Flores and look forward to participating in their care.  A copy of this report was sent to the requesting provider on this date.  Electronically Signed: Brayton El, PA-C 11/17/2018, 8:49 AM   I spent a total of 20 minutes in face to face in clinical consultation, greater than 50% of which was counseling/coordinating care for cerebral angio

## 2018-11-17 NOTE — Sedation Documentation (Signed)
EtCO2 [place] removed per Dr. Anette Guarneri request

## 2018-11-17 NOTE — Sedation Documentation (Signed)
Right fem sheath removed/ 75fr exoseal admin by IR tech.  Ir tech holding pressure to right groin site, at this time

## 2018-11-21 ENCOUNTER — Encounter (HOSPITAL_COMMUNITY): Payer: Self-pay | Admitting: Interventional Radiology

## 2018-11-22 ENCOUNTER — Encounter (HOSPITAL_COMMUNITY): Payer: Self-pay

## 2018-12-23 ENCOUNTER — Ambulatory Visit
Admission: RE | Admit: 2018-12-23 | Discharge: 2018-12-23 | Disposition: A | Discharge status: Home / Self Care | Payer: Medicare Other | Source: Ambulatory Visit | Attending: Family Medicine | Admitting: Family Medicine

## 2018-12-23 ENCOUNTER — Other Ambulatory Visit: Payer: Self-pay

## 2018-12-23 DIAGNOSIS — Z1231 Encounter for screening mammogram for malignant neoplasm of breast: Secondary | ICD-10-CM

## 2019-05-04 ENCOUNTER — Other Ambulatory Visit: Payer: Self-pay | Admitting: Family Medicine

## 2019-05-04 DIAGNOSIS — R1033 Periumbilical pain: Secondary | ICD-10-CM

## 2019-05-17 ENCOUNTER — Ambulatory Visit
Admission: RE | Admit: 2019-05-17 | Discharge: 2019-05-17 | Disposition: A | Discharge status: Home / Self Care | Payer: Medicare Other | Source: Ambulatory Visit | Attending: Family Medicine | Admitting: Family Medicine

## 2019-05-17 DIAGNOSIS — R1033 Periumbilical pain: Secondary | ICD-10-CM

## 2019-05-17 MED ORDER — IOPAMIDOL (ISOVUE-300) INJECTION 61%
100.0000 mL | Freq: Once | INTRAVENOUS | Status: AC | PRN
  Administered 2019-05-17: 100 mL via INTRAVENOUS

## 2019-06-15 DIAGNOSIS — R1033 Periumbilical pain: Secondary | ICD-10-CM | POA: Diagnosis not present

## 2019-07-03 DIAGNOSIS — L259 Unspecified contact dermatitis, unspecified cause: Secondary | ICD-10-CM | POA: Diagnosis not present

## 2019-09-21 DIAGNOSIS — I10 Essential (primary) hypertension: Secondary | ICD-10-CM | POA: Diagnosis not present

## 2019-09-21 DIAGNOSIS — R399 Unspecified symptoms and signs involving the genitourinary system: Secondary | ICD-10-CM | POA: Diagnosis not present

## 2019-09-21 DIAGNOSIS — F418 Other specified anxiety disorders: Secondary | ICD-10-CM | POA: Diagnosis not present

## 2019-10-11 DIAGNOSIS — R1033 Periumbilical pain: Secondary | ICD-10-CM | POA: Diagnosis not present

## 2019-10-31 DIAGNOSIS — Z811 Family history of alcohol abuse and dependence: Secondary | ICD-10-CM | POA: Diagnosis not present

## 2019-10-31 DIAGNOSIS — E663 Overweight: Secondary | ICD-10-CM | POA: Diagnosis not present

## 2019-10-31 DIAGNOSIS — E785 Hyperlipidemia, unspecified: Secondary | ICD-10-CM | POA: Diagnosis not present

## 2019-10-31 DIAGNOSIS — Z8249 Family history of ischemic heart disease and other diseases of the circulatory system: Secondary | ICD-10-CM | POA: Diagnosis not present

## 2019-10-31 DIAGNOSIS — Z833 Family history of diabetes mellitus: Secondary | ICD-10-CM | POA: Diagnosis not present

## 2019-10-31 DIAGNOSIS — Z7982 Long term (current) use of aspirin: Secondary | ICD-10-CM | POA: Diagnosis not present

## 2019-10-31 DIAGNOSIS — I10 Essential (primary) hypertension: Secondary | ICD-10-CM | POA: Diagnosis not present

## 2019-10-31 DIAGNOSIS — Z6826 Body mass index (BMI) 26.0-26.9, adult: Secondary | ICD-10-CM | POA: Diagnosis not present

## 2019-10-31 DIAGNOSIS — Z809 Family history of malignant neoplasm, unspecified: Secondary | ICD-10-CM | POA: Diagnosis not present

## 2019-11-14 DIAGNOSIS — M859 Disorder of bone density and structure, unspecified: Secondary | ICD-10-CM | POA: Diagnosis not present

## 2019-11-14 DIAGNOSIS — Z23 Encounter for immunization: Secondary | ICD-10-CM | POA: Diagnosis not present

## 2019-11-14 DIAGNOSIS — Z6826 Body mass index (BMI) 26.0-26.9, adult: Secondary | ICD-10-CM | POA: Diagnosis not present

## 2019-11-14 DIAGNOSIS — E663 Overweight: Secondary | ICD-10-CM | POA: Diagnosis not present

## 2019-11-14 DIAGNOSIS — Z9889 Other specified postprocedural states: Secondary | ICD-10-CM | POA: Diagnosis not present

## 2019-11-14 DIAGNOSIS — I10 Essential (primary) hypertension: Secondary | ICD-10-CM | POA: Diagnosis not present

## 2019-11-14 DIAGNOSIS — Z0000 Encounter for general adult medical examination without abnormal findings: Secondary | ICD-10-CM | POA: Diagnosis not present

## 2019-11-14 DIAGNOSIS — E785 Hyperlipidemia, unspecified: Secondary | ICD-10-CM | POA: Diagnosis not present

## 2019-12-11 ENCOUNTER — Ambulatory Visit (INDEPENDENT_AMBULATORY_CARE_PROVIDER_SITE_OTHER): Payer: Medicare PPO

## 2019-12-11 ENCOUNTER — Ambulatory Visit: Payer: Medicare PPO | Admitting: Podiatry

## 2019-12-11 ENCOUNTER — Encounter: Payer: Self-pay | Admitting: Podiatry

## 2019-12-11 ENCOUNTER — Other Ambulatory Visit: Payer: Self-pay

## 2019-12-11 DIAGNOSIS — M7751 Other enthesopathy of right foot: Secondary | ICD-10-CM

## 2019-12-11 DIAGNOSIS — M779 Enthesopathy, unspecified: Secondary | ICD-10-CM

## 2019-12-11 DIAGNOSIS — M7752 Other enthesopathy of left foot: Secondary | ICD-10-CM

## 2019-12-11 DIAGNOSIS — G5761 Lesion of plantar nerve, right lower limb: Secondary | ICD-10-CM

## 2019-12-11 DIAGNOSIS — M7750 Other enthesopathy of unspecified foot: Secondary | ICD-10-CM

## 2019-12-11 DIAGNOSIS — M722 Plantar fascial fibromatosis: Secondary | ICD-10-CM

## 2019-12-11 NOTE — Patient Instructions (Signed)

## 2019-12-11 NOTE — Progress Notes (Signed)
Subjective:   Patient ID: Kelli Flores, female   DOB: 69 y.o.   MRN: [id number]   HPI 69 year old female presents the office with concerns about foot discomfort.  She states that she was diagnosed with plantar fasciitis about 25 years ago and she gets occasional discomfort particular if she has been on her feet a lot or wearing certain shoes but she notes over the summer she started to have more heel pain on the bottom of her heel pointing to the plantar as well as medial aspect of the heel.  She denies any recent injury or trauma.  She also has not started to notice a feeling on the ball of her foot pointing submetatarsal 2 but no significant pain.  She also gets intermittent sporadic sharp pain into her right third and fourth toes.,  Currently not have any discomfort.  No recent injury bilaterally.  No recent treatment.  She was to make sure that she does not have gout.   Review of Systems  All other systems reviewed and are negative.  Past Medical History:  Diagnosis Date  . Hypertension     Past Surgical History:  Procedure Laterality Date  . IR ANGIO INTRA EXTRACRAN SEL COM CAROTID INNOMINATE UNI L MOD SED  11/17/2018  . IR ANGIO VERTEBRAL SEL VERTEBRAL UNI L MOD SED  11/17/2018  . IR GENERIC HISTORICAL  02/14/2016   IR ANGIO INTRA EXTRACRAN SEL COM CAROTID INNOMINATE BILAT MOD SED 02/14/2016 Julieanne Cotton, MD [facility]  . IR GENERIC HISTORICAL  02/14/2016   IR ANGIO VERTEBRAL SEL VERTEBRAL BILAT MOD SED 02/14/2016 Julieanne Cotton, MD [facility]  . IR RADIOLOGIST EVAL & MGMT  03/31/2017     Current Outpatient Medications:  .  aspirin EC 81 MG tablet, Take 81 mg by mouth every evening., Disp: , Rfl:  .  atenolol-chlorthalidone (TENORETIC) 50-25 MG per tablet, Take 0.5 tablets by mouth every morning. , Disp: , Rfl:  .  Calcium Carb-Cholecalciferol (CALCIUM 1000 + D) 1000-800 MG-UNIT TABS, Take 1 tablet by mouth daily with supper. Only take 5 days out of the week,  Disp: , Rfl:  .  Cholecalciferol (VITAMIN D) 125 MCG (5000 UT) CAPS, Take 1,000 Units by mouth daily. 5 days out of the week, Disp: , Rfl:  .  Multiple Vitamins-Minerals (MULTIVITAMIN WITH MINERALS) tablet, Take 1 tablet by mouth daily., Disp: , Rfl:  .  simvastatin (ZOCOR) 40 MG tablet, Take 40 mg by mouth every evening. , Disp: , Rfl:   Allergies  Allergen Reactions  . Shellfish Allergy Hives  . Codeine Rash         Objective:  Physical Exam  General: AAO x3, NAD  Dermatological: Skin is warm, dry and supple bilateral. There are no open sores, no preulcerative lesions, no rash or signs of infection present.  Vascular: Dorsalis Pedis artery and Posterior Tibial artery pedal pulses are 2/4 bilateral with immedate capillary fill time.There is no pain with calf compression, swelling, warmth, erythema.   Neruologic: Grossly intact via light touch bilateral.  Negative Tinel sign.  Musculoskeletal: Unable to elicit any significant area of tenderness bilaterally.  There is no palpable neuroma or area pinpoint tenderness on the right foot which definitely she is describing symptoms consistent with a neuroma.  On the left foot she notices a "fullness" sensation in the ball of her foot pointing submetatarsal 2 please no significant edema, erythema.  There is no tenderness to this area.  No significant pain to the heel  today on the course or insertion of plantar fascial.  Flexor, extensor tendons.  Be intact.  No pain with Achilles tendon.  Muscular strength 5/5 in all groups tested bilateral.  Gait: Unassisted, Nonantalgic.       Assessment:   69 year old female with likely neuroma right foot, plantar fasciitis/capsulitis left foot     Plan:  -Treatment options discussed including all alternatives, risks, and complications -Etiology of symptoms were discussed -X-rays were obtained and reviewed with the patient.  There is no evidence of acute fracture or stress fracture identified  today. -We discussed anti-inflammatory medication as well as injection but she was to hold off on that.  Discussed Voltaren gel.  Also we discussed shoe modifications wearing supportive shoes.  Stretching exercises daily as well.  I do not see any evidence of active gout at this time.  Vivi Barrack DPM

## 2019-12-25 ENCOUNTER — Other Ambulatory Visit: Payer: Self-pay | Admitting: Obstetrics & Gynecology

## 2019-12-25 DIAGNOSIS — Z1231 Encounter for screening mammogram for malignant neoplasm of breast: Secondary | ICD-10-CM

## 2019-12-28 ENCOUNTER — Ambulatory Visit: Payer: Medicare PPO | Attending: Internal Medicine

## 2019-12-28 DIAGNOSIS — Z23 Encounter for immunization: Secondary | ICD-10-CM

## 2019-12-28 NOTE — Progress Notes (Signed)
   Covid-19 Vaccination Clinic  Name:  Kelli Flores    MRN: [id number] DOB: 1950-05-16  12/28/2019  Kelli Flores was observed post Covid-19 immunization for 30 minutes based on pre-vaccination screening without incident. She was provided with Vaccine Information Sheet and instruction to access the V-Safe system.   Kelli Flores was instructed to call 911 with any severe reactions post vaccine: Marland Kitchen Difficulty breathing  . Swelling of face and throat  . A fast heartbeat  . A bad rash all over body  . Dizziness and weakness

## 2020-01-26 DIAGNOSIS — I10 Essential (primary) hypertension: Secondary | ICD-10-CM | POA: Diagnosis not present

## 2020-01-26 DIAGNOSIS — E785 Hyperlipidemia, unspecified: Secondary | ICD-10-CM | POA: Diagnosis not present

## 2020-01-26 DIAGNOSIS — R42 Dizziness and giddiness: Secondary | ICD-10-CM | POA: Diagnosis not present

## 2020-01-26 DIAGNOSIS — Z6825 Body mass index (BMI) 25.0-25.9, adult: Secondary | ICD-10-CM | POA: Diagnosis not present

## 2020-01-30 DIAGNOSIS — L723 Sebaceous cyst: Secondary | ICD-10-CM | POA: Diagnosis not present

## 2020-01-30 DIAGNOSIS — L089 Local infection of the skin and subcutaneous tissue, unspecified: Secondary | ICD-10-CM | POA: Diagnosis not present

## 2020-02-01 DIAGNOSIS — Z01419 Encounter for gynecological examination (general) (routine) without abnormal findings: Secondary | ICD-10-CM | POA: Diagnosis not present

## 2020-02-01 DIAGNOSIS — I10 Essential (primary) hypertension: Secondary | ICD-10-CM | POA: Insufficient documentation

## 2020-02-01 DIAGNOSIS — Z124 Encounter for screening for malignant neoplasm of cervix: Secondary | ICD-10-CM | POA: Diagnosis not present

## 2020-02-01 DIAGNOSIS — E785 Hyperlipidemia, unspecified: Secondary | ICD-10-CM | POA: Insufficient documentation

## 2020-02-01 DIAGNOSIS — K589 Irritable bowel syndrome without diarrhea: Secondary | ICD-10-CM | POA: Insufficient documentation

## 2020-02-01 DIAGNOSIS — Z6825 Body mass index (BMI) 25.0-25.9, adult: Secondary | ICD-10-CM | POA: Diagnosis not present

## 2020-02-13 ENCOUNTER — Ambulatory Visit
Admission: RE | Admit: 2020-02-13 | Discharge: 2020-02-13 | Disposition: A | Discharge status: Home / Self Care | Payer: Medicare PPO | Source: Ambulatory Visit | Attending: Obstetrics & Gynecology | Admitting: Obstetrics & Gynecology

## 2020-02-13 ENCOUNTER — Other Ambulatory Visit: Payer: Self-pay

## 2020-02-13 DIAGNOSIS — Z1231 Encounter for screening mammogram for malignant neoplasm of breast: Secondary | ICD-10-CM

## 2020-02-22 DIAGNOSIS — I10 Essential (primary) hypertension: Secondary | ICD-10-CM | POA: Diagnosis not present

## 2020-02-22 DIAGNOSIS — Z6825 Body mass index (BMI) 25.0-25.9, adult: Secondary | ICD-10-CM | POA: Diagnosis not present

## 2020-05-15 DIAGNOSIS — H04123 Dry eye syndrome of bilateral lacrimal glands: Secondary | ICD-10-CM | POA: Diagnosis not present

## 2020-05-15 DIAGNOSIS — H1045 Other chronic allergic conjunctivitis: Secondary | ICD-10-CM | POA: Diagnosis not present

## 2020-05-15 DIAGNOSIS — Z961 Presence of intraocular lens: Secondary | ICD-10-CM | POA: Diagnosis not present

## 2020-06-11 DIAGNOSIS — H35363 Drusen (degenerative) of macula, bilateral: Secondary | ICD-10-CM | POA: Diagnosis not present

## 2020-06-11 DIAGNOSIS — H26493 Other secondary cataract, bilateral: Secondary | ICD-10-CM | POA: Diagnosis not present

## 2020-06-11 DIAGNOSIS — H04123 Dry eye syndrome of bilateral lacrimal glands: Secondary | ICD-10-CM | POA: Diagnosis not present

## 2020-06-11 DIAGNOSIS — Z961 Presence of intraocular lens: Secondary | ICD-10-CM | POA: Diagnosis not present

## 2020-06-11 DIAGNOSIS — H1045 Other chronic allergic conjunctivitis: Secondary | ICD-10-CM | POA: Diagnosis not present

## 2020-09-10 DIAGNOSIS — E785 Hyperlipidemia, unspecified: Secondary | ICD-10-CM | POA: Diagnosis not present

## 2020-09-10 DIAGNOSIS — F418 Other specified anxiety disorders: Secondary | ICD-10-CM | POA: Diagnosis not present

## 2020-09-10 DIAGNOSIS — I10 Essential (primary) hypertension: Secondary | ICD-10-CM | POA: Diagnosis not present

## 2020-09-10 DIAGNOSIS — M8580 Other specified disorders of bone density and structure, unspecified site: Secondary | ICD-10-CM | POA: Diagnosis not present

## 2020-09-23 DIAGNOSIS — R1031 Right lower quadrant pain: Secondary | ICD-10-CM | POA: Diagnosis not present

## 2020-09-23 DIAGNOSIS — Z800 Family history of malignant neoplasm of digestive organs: Secondary | ICD-10-CM | POA: Diagnosis not present

## 2020-11-26 DIAGNOSIS — Z800 Family history of malignant neoplasm of digestive organs: Secondary | ICD-10-CM | POA: Diagnosis not present

## 2020-11-26 DIAGNOSIS — K589 Irritable bowel syndrome without diarrhea: Secondary | ICD-10-CM | POA: Diagnosis not present

## 2020-12-03 DIAGNOSIS — Z9889 Other specified postprocedural states: Secondary | ICD-10-CM | POA: Diagnosis not present

## 2020-12-03 DIAGNOSIS — E785 Hyperlipidemia, unspecified: Secondary | ICD-10-CM | POA: Diagnosis not present

## 2020-12-03 DIAGNOSIS — M8580 Other specified disorders of bone density and structure, unspecified site: Secondary | ICD-10-CM | POA: Diagnosis not present

## 2020-12-03 DIAGNOSIS — Z23 Encounter for immunization: Secondary | ICD-10-CM | POA: Diagnosis not present

## 2020-12-03 DIAGNOSIS — I10 Essential (primary) hypertension: Secondary | ICD-10-CM | POA: Diagnosis not present

## 2020-12-03 DIAGNOSIS — F418 Other specified anxiety disorders: Secondary | ICD-10-CM | POA: Diagnosis not present

## 2020-12-03 DIAGNOSIS — I770 Arteriovenous fistula, acquired: Secondary | ICD-10-CM | POA: Diagnosis not present

## 2020-12-03 DIAGNOSIS — Z0000 Encounter for general adult medical examination without abnormal findings: Secondary | ICD-10-CM | POA: Diagnosis not present

## 2020-12-03 DIAGNOSIS — R29898 Other symptoms and signs involving the musculoskeletal system: Secondary | ICD-10-CM | POA: Diagnosis not present

## 2020-12-09 ENCOUNTER — Encounter: Payer: Self-pay | Admitting: Neurology

## 2020-12-23 DIAGNOSIS — H5789 Other specified disorders of eye and adnexa: Secondary | ICD-10-CM | POA: Diagnosis not present

## 2020-12-23 DIAGNOSIS — H01139 Eczematous dermatitis of unspecified eye, unspecified eyelid: Secondary | ICD-10-CM | POA: Diagnosis not present

## 2021-01-01 ENCOUNTER — Other Ambulatory Visit (HOSPITAL_COMMUNITY): Payer: Self-pay | Admitting: Interventional Radiology

## 2021-01-01 DIAGNOSIS — I770 Arteriovenous fistula, acquired: Secondary | ICD-10-CM

## 2021-01-10 ENCOUNTER — Other Ambulatory Visit: Payer: Self-pay | Admitting: Family Medicine

## 2021-01-10 DIAGNOSIS — Z1231 Encounter for screening mammogram for malignant neoplasm of breast: Secondary | ICD-10-CM

## 2021-01-20 ENCOUNTER — Ambulatory Visit (HOSPITAL_COMMUNITY)
Admission: RE | Admit: 2021-01-20 | Discharge: 2021-01-20 | Disposition: A | Discharge status: Home / Self Care | Payer: Medicare PPO | Source: Ambulatory Visit | Attending: Interventional Radiology | Admitting: Interventional Radiology

## 2021-01-20 ENCOUNTER — Other Ambulatory Visit: Payer: Self-pay

## 2021-01-20 DIAGNOSIS — I770 Arteriovenous fistula, acquired: Secondary | ICD-10-CM

## 2021-01-20 DIAGNOSIS — G9389 Other specified disorders of brain: Secondary | ICD-10-CM | POA: Diagnosis not present

## 2021-01-22 ENCOUNTER — Telehealth (HOSPITAL_COMMUNITY): Payer: Self-pay

## 2021-01-22 NOTE — Telephone Encounter (Signed)
Called pt regarding recent imaging, no answer, left vm. AW  

## 2021-01-28 ENCOUNTER — Telehealth (HOSPITAL_COMMUNITY): Payer: Self-pay

## 2021-01-28 NOTE — Telephone Encounter (Signed)
Pt agreed to f/u in 1 year with cta head/neck. AW  

## 2021-02-10 ENCOUNTER — Other Ambulatory Visit: Payer: Self-pay

## 2021-02-10 ENCOUNTER — Encounter: Payer: Self-pay | Admitting: Neurology

## 2021-02-10 ENCOUNTER — Ambulatory Visit: Payer: Medicare PPO | Admitting: Neurology

## 2021-02-10 VITALS — BP 158/92 | HR 64 | Ht 64.5 in | Wt 151.0 lb

## 2021-02-10 DIAGNOSIS — R29898 Other symptoms and signs involving the musculoskeletal system: Secondary | ICD-10-CM | POA: Diagnosis not present

## 2021-02-10 NOTE — Patient Instructions (Signed)
If your symptoms get worse, please let me know.

## 2021-02-10 NOTE — Progress Notes (Signed)
[facility] Neurology Division Clinic Note - Initial Visit   Date: 02/10/21  Kelli Flores MRN: [id number] DOB: 22-Jul-1950   Dear Kelli Cipro, NP:  Thank you for your kind referral of Kelli Flores for consultation of left side weakness. Although her history is well known to you, please allow [place] to reiterate it for the purpose of our medical record. The patient was accompanied to the clinic by self.    History of Present Illness: Kelli Flores is a 70 y.o. right-handed female with hypertension and left subocciptal dural AV fisula s/p coiling (2011) presenting for evaluation of left arm and leg weakness.   Starting around September 2022, she began having slight sensation weakness of the left upper arm and forearm which lasted about 3 minutes. She tested her arm and was able to raise it, no problems with pulling/pushing/grip, but it felt weak.  Later that day, she developed weak sensation over the left lower leg, which lasted 3 minutes.  It has occurs about twice in the arm and about 5 times in the leg.  She does not have frank weakness, but reports more of a sensation of weakness. No associated numbness/tingling. No facial weakness, difficulty with speech or swallow.  Patient noted to have suboccipital pulsatile tinnitus due to left suboccipital dural AV fistula and underwent coiling in 2011  Out-side paper records, electronic medical record, and images have been reviewed where available and summarized as:  MRI/A head 01/20/2021: 1. Unchanged degree of flow related enhancement within the treated left skull base arteriovenous fistula. 2. Otherwise normal MRI of the brain.    Past Medical History:  Diagnosis Date   Hypertension     Past Surgical History:  Procedure Laterality Date   IR ANGIO INTRA EXTRACRAN SEL COM CAROTID INNOMINATE UNI L MOD SED  11/17/2018   IR ANGIO VERTEBRAL SEL VERTEBRAL UNI L MOD SED  11/17/2018   IR GENERIC HISTORICAL   02/14/2016   IR ANGIO INTRA EXTRACRAN SEL COM CAROTID INNOMINATE BILAT MOD SED 02/14/2016 Julieanne Cotton, MD [facility]   IR GENERIC HISTORICAL  02/14/2016   IR ANGIO VERTEBRAL SEL VERTEBRAL BILAT MOD SED 02/14/2016 Julieanne Cotton, MD [facility]   IR RADIOLOGIST EVAL & MGMT  03/31/2017     Medications:  Outpatient Encounter Medications as of 02/10/2021  Medication Sig   aspirin EC 81 MG tablet Take 81 mg by mouth every evening.   atenolol-chlorthalidone (TENORETIC) 50-25 MG per tablet Take 0.5 tablets by mouth every morning.    Calcium Carb-Cholecalciferol 1000-800 MG-UNIT TABS Take 1 tablet by mouth daily with supper. Only take 5 days out of the week   Cholecalciferol (VITAMIN D) 125 MCG (5000 UT) CAPS Take 1,000 Units by mouth daily. 5 days out of the week   Multiple Vitamins-Minerals (MULTIVITAMIN WITH MINERALS) tablet Take 1 tablet by mouth daily.   simvastatin (ZOCOR) 40 MG tablet Take 40 mg by mouth every evening.    No facility-administered encounter medications on file as of 02/10/2021.    Allergies:  Allergies  Allergen Reactions   Shellfish Allergy Hives   Codeine Rash    Family History: Family History  Problem Relation Age of Onset   High blood pressure Mother    Kidney failure Mother    Congestive Heart Failure Mother    Congestive Heart Failure Father    High blood pressure Father     Social History: Social History   Tobacco Use   Smoking status: Never  Substance Use Topics  Alcohol use: Yes    Comment: Occasional Drinks   Drug use: Never   Social History   Social History Narrative   Has 1 son.    Right Handed    Lives in a one story home     Vital Signs:  BP (!) 158/92    Pulse 64    Ht 5' 4.5" (1.638 m)    Wt 151 lb (68.5 kg)    SpO2 100%    BMI 25.52 kg/m   Neurological Exam: MENTAL STATUS including orientation to time, place, person, recent and remote memory, attention span and concentration, language, and fund of knowledge is  normal.  Speech is not dysarthric.  CRANIAL NERVES: II:  No visual field defects.   III-IV-VI: Pupils equal round and reactive to light.  Normal conjugate, extra-ocular eye movements in all directions of gaze.  No nystagmus.  No ptosis.   V:  Normal facial sensation.    VII:  Normal facial symmetry and movements.   VIII:  Normal hearing and vestibular function.   IX-X:  Normal palatal movement.   XI:  Normal shoulder shrug and head rotation.   XII:  Normal tongue strength and range of motion, no deviation or fasciculation.  MOTOR:  No atrophy, fasciculations or abnormal movements.  No pronator drift.   Upper Extremity:  Right  Left  Deltoid  5/5   5/5   Biceps  5/5   5/5   Triceps  5/5   5/5   Infraspinatus 5/5  5/5  Medial pectoralis 5/5  5/5  Wrist extensors  5/5   5/5   Wrist flexors  5/5   5/5   Finger extensors  5/5   5/5   Finger flexors  5/5   5/5   Dorsal interossei  5/5   5/5   Abductor pollicis  5/5   5/5   Tone (Ashworth scale)  0  0   Lower Extremity:  Right  Left  Hip flexors  5/5   5/5   Hip extensors  5/5   5/5   Adductor 5/5  5/5  Abductor 5/5  5/5  Knee flexors  5/5   5/5   Knee extensors  5/5   5/5   Dorsiflexors  5/5   5/5   Plantarflexors  5/5   5/5   Toe extensors  5/5   5/5   Toe flexors  5/5   5/5   Tone (Ashworth scale)  0  0   MSRs:  Right        Left                  brachioradialis 2+  2+  biceps 2+  2+  triceps 2+  2+  patellar 2+  2+  ankle jerk 2+  2+  Hoffman no  no  plantar response down  down   SENSORY:  Normal and symmetric perception of light touch, pinprick, vibration, and proprioception.  Romberg's sign absent.   COORDINATION/GAIT: Normal finger-to- nose-finger and heel-to-shin.  Intact rapid alternating movements bilaterally.  Able to rise from a chair without using arms.  Gait narrow based and stable. Tandem and stressed gait intact.    IMPRESSION: Transient left and and leg weakness (subjective). She describes sensation  of weakness, without heaviness or sensory disturbance. Symptoms are not typical for TIA, stroke, or seizure. MRI brain from 12/2020 reviewed and was normal. Further, her dural AV fistula is on the left side, which would not cause left sided  symptoms. Her exam is normal and does not suggest peripheral nerve pathology, either. I offered NCS/EMG of the left side, which she will think about if her symptoms return or get worse.    Thank you for allowing me to participate in patient's care.  If I can answer any additional questions, I would be pleased to do so.    Sincerely,    Dwight Burdo K. Allena Katz, DO

## 2021-02-18 ENCOUNTER — Ambulatory Visit: Payer: Medicare PPO

## 2021-03-06 ENCOUNTER — Ambulatory Visit: Payer: Medicare PPO

## 2021-03-07 ENCOUNTER — Ambulatory Visit
Admission: RE | Admit: 2021-03-07 | Discharge: 2021-03-07 | Disposition: A | Discharge status: Home / Self Care | Payer: Medicare PPO | Source: Ambulatory Visit | Attending: Family Medicine | Admitting: Family Medicine

## 2021-03-07 DIAGNOSIS — Z1231 Encounter for screening mammogram for malignant neoplasm of breast: Secondary | ICD-10-CM | POA: Diagnosis not present

## 2021-03-20 DIAGNOSIS — Z01419 Encounter for gynecological examination (general) (routine) without abnormal findings: Secondary | ICD-10-CM | POA: Diagnosis not present

## 2021-03-20 DIAGNOSIS — Z6823 Body mass index (BMI) 23.0-23.9, adult: Secondary | ICD-10-CM | POA: Diagnosis not present

## 2021-05-26 DIAGNOSIS — I10 Essential (primary) hypertension: Secondary | ICD-10-CM | POA: Diagnosis not present

## 2021-05-26 DIAGNOSIS — Z79899 Other long term (current) drug therapy: Secondary | ICD-10-CM | POA: Diagnosis not present

## 2021-05-26 DIAGNOSIS — Z9889 Other specified postprocedural states: Secondary | ICD-10-CM | POA: Diagnosis not present

## 2021-06-11 DIAGNOSIS — H04123 Dry eye syndrome of bilateral lacrimal glands: Secondary | ICD-10-CM | POA: Diagnosis not present

## 2021-06-11 DIAGNOSIS — H26493 Other secondary cataract, bilateral: Secondary | ICD-10-CM | POA: Diagnosis not present

## 2021-06-11 DIAGNOSIS — H35372 Puckering of macula, left eye: Secondary | ICD-10-CM | POA: Diagnosis not present

## 2021-06-11 DIAGNOSIS — H1045 Other chronic allergic conjunctivitis: Secondary | ICD-10-CM | POA: Diagnosis not present

## 2021-06-11 DIAGNOSIS — Z961 Presence of intraocular lens: Secondary | ICD-10-CM | POA: Diagnosis not present

## 2021-06-11 DIAGNOSIS — H35363 Drusen (degenerative) of macula, bilateral: Secondary | ICD-10-CM | POA: Diagnosis not present

## 2021-06-27 DIAGNOSIS — L708 Other acne: Secondary | ICD-10-CM | POA: Diagnosis not present

## 2021-10-14 DIAGNOSIS — H43811 Vitreous degeneration, right eye: Secondary | ICD-10-CM | POA: Diagnosis not present

## 2021-10-14 DIAGNOSIS — Z961 Presence of intraocular lens: Secondary | ICD-10-CM | POA: Diagnosis not present

## 2021-10-14 DIAGNOSIS — H35372 Puckering of macula, left eye: Secondary | ICD-10-CM | POA: Diagnosis not present

## 2021-10-14 DIAGNOSIS — H35363 Drusen (degenerative) of macula, bilateral: Secondary | ICD-10-CM | POA: Diagnosis not present

## 2021-10-14 DIAGNOSIS — H04123 Dry eye syndrome of bilateral lacrimal glands: Secondary | ICD-10-CM | POA: Diagnosis not present

## 2021-10-14 DIAGNOSIS — H1045 Other chronic allergic conjunctivitis: Secondary | ICD-10-CM | POA: Diagnosis not present

## 2021-10-14 DIAGNOSIS — H26493 Other secondary cataract, bilateral: Secondary | ICD-10-CM | POA: Diagnosis not present

## 2021-11-04 DIAGNOSIS — H43811 Vitreous degeneration, right eye: Secondary | ICD-10-CM | POA: Diagnosis not present

## 2021-12-09 DIAGNOSIS — E785 Hyperlipidemia, unspecified: Secondary | ICD-10-CM | POA: Diagnosis not present

## 2021-12-09 DIAGNOSIS — I10 Essential (primary) hypertension: Secondary | ICD-10-CM | POA: Diagnosis not present

## 2021-12-09 DIAGNOSIS — Z800 Family history of malignant neoplasm of digestive organs: Secondary | ICD-10-CM | POA: Diagnosis not present

## 2021-12-09 DIAGNOSIS — Z0000 Encounter for general adult medical examination without abnormal findings: Secondary | ICD-10-CM | POA: Diagnosis not present

## 2021-12-09 DIAGNOSIS — K589 Irritable bowel syndrome without diarrhea: Secondary | ICD-10-CM | POA: Diagnosis not present

## 2021-12-09 DIAGNOSIS — Z23 Encounter for immunization: Secondary | ICD-10-CM | POA: Diagnosis not present

## 2021-12-09 DIAGNOSIS — M8580 Other specified disorders of bone density and structure, unspecified site: Secondary | ICD-10-CM | POA: Diagnosis not present

## 2021-12-09 DIAGNOSIS — Z9889 Other specified postprocedural states: Secondary | ICD-10-CM | POA: Diagnosis not present

## 2022-02-06 ENCOUNTER — Encounter: Payer: Self-pay | Admitting: Family Medicine

## 2022-02-12 ENCOUNTER — Other Ambulatory Visit: Payer: Self-pay | Admitting: Family Medicine

## 2022-02-12 DIAGNOSIS — M7989 Other specified soft tissue disorders: Secondary | ICD-10-CM

## 2022-02-12 DIAGNOSIS — R0981 Nasal congestion: Secondary | ICD-10-CM | POA: Diagnosis not present

## 2022-02-12 DIAGNOSIS — R5383 Other fatigue: Secondary | ICD-10-CM | POA: Diagnosis not present

## 2022-02-12 DIAGNOSIS — U071 COVID-19: Secondary | ICD-10-CM | POA: Diagnosis not present

## 2022-02-12 DIAGNOSIS — Z6826 Body mass index (BMI) 26.0-26.9, adult: Secondary | ICD-10-CM | POA: Diagnosis not present

## 2022-02-12 DIAGNOSIS — R051 Acute cough: Secondary | ICD-10-CM | POA: Diagnosis not present

## 2022-03-02 ENCOUNTER — Ambulatory Visit
Admission: RE | Admit: 2022-03-02 | Discharge: 2022-03-02 | Disposition: A | Discharge status: Home / Self Care | Payer: Medicare PPO | Source: Ambulatory Visit | Attending: Family Medicine | Admitting: Family Medicine

## 2022-03-02 DIAGNOSIS — M7989 Other specified soft tissue disorders: Secondary | ICD-10-CM

## 2022-03-02 DIAGNOSIS — M619 Calcification and ossification of muscle, unspecified: Secondary | ICD-10-CM | POA: Diagnosis not present

## 2022-03-23 DIAGNOSIS — Z79899 Other long term (current) drug therapy: Secondary | ICD-10-CM | POA: Diagnosis not present

## 2022-03-31 DIAGNOSIS — M545 Low back pain, unspecified: Secondary | ICD-10-CM | POA: Diagnosis not present

## 2022-03-31 DIAGNOSIS — T61781A Other shellfish poisoning, accidental (unintentional), initial encounter: Secondary | ICD-10-CM | POA: Diagnosis not present

## 2022-03-31 DIAGNOSIS — J302 Other seasonal allergic rhinitis: Secondary | ICD-10-CM | POA: Diagnosis not present

## 2022-03-31 DIAGNOSIS — I10 Essential (primary) hypertension: Secondary | ICD-10-CM | POA: Diagnosis not present

## 2022-03-31 DIAGNOSIS — E785 Hyperlipidemia, unspecified: Secondary | ICD-10-CM | POA: Diagnosis not present

## 2022-03-31 DIAGNOSIS — Z0289 Encounter for other administrative examinations: Secondary | ICD-10-CM | POA: Diagnosis not present

## 2022-03-31 DIAGNOSIS — K5900 Constipation, unspecified: Secondary | ICD-10-CM | POA: Diagnosis not present

## 2022-04-01 DIAGNOSIS — Z1211 Encounter for screening for malignant neoplasm of colon: Secondary | ICD-10-CM | POA: Diagnosis not present

## 2022-04-06 ENCOUNTER — Other Ambulatory Visit: Payer: Self-pay | Admitting: Family

## 2022-04-06 DIAGNOSIS — Z1231 Encounter for screening mammogram for malignant neoplasm of breast: Secondary | ICD-10-CM

## 2022-04-20 ENCOUNTER — Ambulatory Visit
Admission: RE | Admit: 2022-04-20 | Discharge: 2022-04-20 | Disposition: A | Discharge status: Home / Self Care | Payer: Medicare PPO | Source: Ambulatory Visit | Attending: Family | Admitting: Family

## 2022-04-20 DIAGNOSIS — Z1231 Encounter for screening mammogram for malignant neoplasm of breast: Secondary | ICD-10-CM

## 2022-04-23 ENCOUNTER — Other Ambulatory Visit: Payer: Self-pay | Admitting: Family

## 2022-04-23 DIAGNOSIS — R928 Other abnormal and inconclusive findings on diagnostic imaging of breast: Secondary | ICD-10-CM

## 2022-05-07 ENCOUNTER — Ambulatory Visit
Admission: RE | Admit: 2022-05-07 | Discharge: 2022-05-07 | Disposition: A | Discharge status: Home / Self Care | Payer: Medicare PPO | Source: Ambulatory Visit | Attending: Family | Admitting: Family

## 2022-05-07 ENCOUNTER — Other Ambulatory Visit: Payer: Self-pay | Admitting: Family

## 2022-05-07 DIAGNOSIS — R928 Other abnormal and inconclusive findings on diagnostic imaging of breast: Secondary | ICD-10-CM

## 2022-05-07 DIAGNOSIS — R921 Mammographic calcification found on diagnostic imaging of breast: Secondary | ICD-10-CM

## 2022-05-14 ENCOUNTER — Ambulatory Visit: Payer: Medicare PPO | Attending: Family

## 2022-05-14 DIAGNOSIS — G8929 Other chronic pain: Secondary | ICD-10-CM | POA: Diagnosis present

## 2022-05-14 DIAGNOSIS — M5442 Lumbago with sciatica, left side: Secondary | ICD-10-CM | POA: Diagnosis present

## 2022-05-14 DIAGNOSIS — M6281 Muscle weakness (generalized): Secondary | ICD-10-CM | POA: Diagnosis present

## 2022-05-14 DIAGNOSIS — M5416 Radiculopathy, lumbar region: Secondary | ICD-10-CM | POA: Diagnosis not present

## 2022-05-14 NOTE — Therapy (Signed)
OUTPATIENT PHYSICAL THERAPY THORACOLUMBAR EVALUATION   Patient Name: Kelli Flores MRN: [id number] DOB:1950/04/22, 72 y.o., female Today's Date: 05/14/2022  END OF SESSION:  PT End of Session - 05/14/22 1143     Visit Number 1    Date for PT Re-Evaluation 07/23/22    PT Start Time [id number]    PT Stop Time 1230    PT Time Calculation (min) 45 min    Activity Tolerance Patient tolerated treatment well    Behavior During Therapy WFL for tasks assessed/performed             Past Medical History:  Diagnosis Date   Hypertension    Past Surgical History:  Procedure Laterality Date   IR ANGIO INTRA EXTRACRAN SEL COM CAROTID INNOMINATE UNI L MOD SED  11/17/2018   IR ANGIO VERTEBRAL SEL VERTEBRAL UNI L MOD SED  11/17/2018   IR GENERIC HISTORICAL  02/14/2016   IR ANGIO INTRA EXTRACRAN SEL COM CAROTID INNOMINATE BILAT MOD SED 02/14/2016 Kelli Bras, MD [facility]   IR GENERIC HISTORICAL  02/14/2016   IR ANGIO VERTEBRAL SEL VERTEBRAL BILAT MOD SED 02/14/2016 Kelli Bras, MD [facility]   IR RADIOLOGIST EVAL & MGMT  03/31/2017   There are no problems to display for this patient.   PCP: Kelli Flores  REFERRING PROVIDER: Novella Flores  REFERRING DIAG:  M54.50 (ICD-10-CM) - Low back pain, unspecified      Rationale for Evaluation and Treatment: Rehabilitation  THERAPY DIAG:  Radiculopathy, lumbar region  Chronic left-sided low back pain with left-sided sciatica  Muscle weakness (generalized)  ONSET DATE: 05/01/22  SUBJECTIVE:                                                                                                                                                                                           SUBJECTIVE STATEMENT: Feeling tired but good. Its in the low back and it radiates into the butt and sometimes down in to the leg but not in the foot. It is just on the left side. No numbness or tingling it just radiates. In the last 2 years it has  gotten worse.   PERTINENT HISTORY:  No remarkable history  PAIN:  Are you having pain? Yes: NPRS scale: 3 today but can get up to a 8/10 Pain location: L low back into glute and down leg Pain description: dull, constant, ache Aggravating factors: stress,  Relieving factors: hot water bottle, movement helps  PRECAUTIONS: None  WEIGHT BEARING RESTRICTIONS: No  FALLS:  Has patient fallen in last 6 months? No  LIVING ENVIRONMENT: Lives with: lives alone Lives in: House/apartment Stairs: Yes:  External: 6 steps; can reach both Has following equipment at home: None  OCCUPATION: Retired  PLOF: Independent  PATIENT GOALS: to alleviate the pain, fix whatever is wrong    OBJECTIVE:   DIAGNOSTIC FINDINGS:  None for the back  PATIENT SURVEYS:  FOTO 63  SCREENING FOR RED FLAGS: Bowel or bladder incontinence: No Spinal tumors: No Cauda equina syndrome: No Compression fracture: No Abdominal aneurysm: No  COGNITION: Overall cognitive status: Within functional limits for tasks assessed     SENSATION: WFL  MUSCLE LENGTH: Hamstrings: good flexibility   POSTURE: No Significant postural limitations  PALPATION: TTP L1-L5 spinous and transverse processes, bilateral SIJ, pain with compression on sacrum  LUMBAR ROM:   AROM eval  Flexion WNL  Extension 50% with pain  Right lateral flexion WFL but pain at end range  Left lateral flexion WFL but pain at end range  Right rotation 75%  Left rotation 75% with pain   (Blank rows = not tested)  LOWER EXTREMITY ROM:  grossly WFL    LOWER EXTREMITY MMT:  grossly 5/5 LE's    LUMBAR SPECIAL TESTS:  Straight leg raise test: Negative, Slump test: Negative, FABER test: Positive, and Gaenslen's test: Positive  FUNCTIONAL TESTS:  5 times sit to stand: 10.97s   TODAY'S TREATMENT:                                                                                                                              DATE: 05/14/22-EVAL      PATIENT EDUCATION:  Education details: POC and HEP Person educated: Patient Education method: Explanation Education comprehension: verbalized understanding  HOME EXERCISE PROGRAM: Access Code: [email or web address] URL: [email or web address] Date: 05/14/2022 Prepared by: Andris Baumann  Exercises - Supine Lower Trunk Rotation  - 1 x daily - 7 x weekly - 2 sets - 10 reps - Supine Bridge  - 1 x daily - 7 x weekly - 2 sets - 10 reps - Supine Double Knee to Chest Modified  - 1 x daily - 7 x weekly - 30 hold - Clamshell with Resistance  - 1 x daily - 7 x weekly - 2 sets - 10 reps  ASSESSMENT:  CLINICAL IMPRESSION: Patient is a 72 y.o. female who was seen today for physical therapy evaluation and treatment for LBP. She has been dealing with this pain for several years and it has gradually gotten worse. She presents with decreased lumbar mobility and pain. She has good range and strength in her legs. Presents with some core weakness. Patient has some SIJ She reports her pain levels are worse with increased stress. 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 Patient will benefit from skilled PT to address her low back pain to be able to complete household chores without an increase in pain.   OBJECTIVE IMPAIRMENTS: pain.  REHAB POTENTIAL: Good  CLINICAL DECISION MAKING: Stable/uncomplicated  EVALUATION COMPLEXITY: Low   GOALS: Goals reviewed with patient? Yes  SHORT TERM GOALS: Target date: 06/18/22  Patient will be independent with initial HEP.  Baseline: given 05/14/22 Goal status: INITIAL  2.  Patient will report centralization of radicular symptoms.  Baseline: radiates into LLE Goal status: INITIAL   LONG TERM GOALS: Target date: 07/23/22  Patient will be independent with advanced/ongoing HEP to improve outcomes and carryover.  Goal status:  INITIAL  2.  Patient will report 75% improvement in low back pain to improve QOL.  Baseline: 8/10 at worst Goal status: INITIAL  3.  Patient will demonstrate full pain free lumbar ROM to perform ADLs.   Baseline: see table above Goal status: INITIAL  4.  Patient will report 72 on lumbar FOTO to demonstrate improved functional ability.  Baseline: 63 Goal status: INITIAL   5.  Patient will be able to sweep and mop without an increase in back pain Baseline: pain with both activities Goal status: INITIAL   PLAN:  PT FREQUENCY: 1-2x/week  PT DURATION: 10 weeks  PLANNED INTERVENTIONS: Therapeutic exercises, Therapeutic activity, Neuromuscular re-education, Balance training, Gait training, Patient/Family education, Self Care, Joint mobilization, Dry Needling, Spinal manipulation, Spinal mobilization, Cryotherapy, Moist heat, Vasopneumatic device, Traction, Ionotophoresis 4mg /ml Dexamethasone, and Manual therapy.  PLAN FOR NEXT SESSION: core exercises    Andris Baumann, PT 05/14/2022, 12:40 PM

## 2022-05-20 ENCOUNTER — Ambulatory Visit
Admission: RE | Admit: 2022-05-20 | Discharge: 2022-05-20 | Disposition: A | Discharge status: Home / Self Care | Payer: Medicare PPO | Source: Ambulatory Visit | Attending: Family | Admitting: Family

## 2022-05-20 DIAGNOSIS — R921 Mammographic calcification found on diagnostic imaging of breast: Secondary | ICD-10-CM

## 2022-05-20 HISTORY — PX: BREAST BIOPSY: SHX20

## 2022-05-21 NOTE — Therapy (Signed)
OUTPATIENT PHYSICAL THERAPY THORACOLUMBAR TREATMENT   Patient Name: Kelli Flores MRN: [id number] DOB:06/02/1950, 72 y.o., female Today's Date: 05/25/2022  END OF SESSION:  PT End of Session - 05/25/22 0928     Visit Number 2    Date for PT Re-Evaluation 07/23/22    PT Start Time 0930    PT Stop Time [id number]    PT Time Calculation (min) 45 min    Activity Tolerance Patient tolerated treatment well    Behavior During Therapy [facility] for tasks assessed/performed              Past Medical History:  Diagnosis Date   Hypertension    Past Surgical History:  Procedure Laterality Date   BREAST BIOPSY Right 05/20/2022   MM RT BREAST BX W LOC DEV 1ST LESION IMAGE BX SPEC STEREO GUIDE 05/20/2022 [facility]   IR ANGIO INTRA EXTRACRAN SEL COM CAROTID INNOMINATE UNI L MOD SED  11/17/2018   IR ANGIO VERTEBRAL SEL VERTEBRAL UNI L MOD SED  11/17/2018   IR GENERIC HISTORICAL  02/14/2016   IR ANGIO INTRA EXTRACRAN SEL COM CAROTID INNOMINATE BILAT MOD SED 02/14/2016 [name], MD [facility]   IR GENERIC HISTORICAL  02/14/2016   IR ANGIO VERTEBRAL SEL VERTEBRAL BILAT MOD SED 02/14/2016 [name], MD [facility]   IR RADIOLOGIST EVAL & MGMT  03/31/2017   There are no problems to display for this patient.   PCP: Novella Rob  REFERRING PROVIDER: Novella Rob  REFERRING DIAG:  M54.50 (ICD-10-CM) - Low back pain, unspecified      Rationale for Evaluation and Treatment: Rehabilitation  THERAPY DIAG:  Radiculopathy, lumbar region  Chronic left-sided low back pain with left-sided sciatica  Muscle weakness (generalized)  ONSET DATE: 05/01/22  SUBJECTIVE:                                                                                                                                                                                           SUBJECTIVE STATEMENT: My hip is not hurting. My back is not either. It feels good. It's not as stiff for sure. I am doing  the exercises you gave but not as much I can admit.   PERTINENT HISTORY:  No remarkable history  PAIN:  Are you having pain? Yes: NPRS scale: 0/10 Pain location: L low back into glute and down leg Pain description: dull, constant, ache Aggravating factors: stress,  Relieving factors: hot water bottle, movement helps  PRECAUTIONS: None  WEIGHT BEARING RESTRICTIONS: No  FALLS:  Has patient fallen in last 6 months? No  LIVING ENVIRONMENT: Lives with: lives alone Lives in: House/apartment Stairs:  Yes: External: 6 steps; can reach both Has following equipment at home: None  OCCUPATION: Retired  PLOF: Independent  PATIENT GOALS: to alleviate the pain, fix whatever is wrong    OBJECTIVE:   DIAGNOSTIC FINDINGS:  None for the back  PATIENT SURVEYS:  FOTO 63  SCREENING FOR RED FLAGS: Bowel or bladder incontinence: No Spinal tumors: No Cauda equina syndrome: No Compression fracture: No Abdominal aneurysm: No  COGNITION: Overall cognitive status: Within functional limits for tasks assessed     SENSATION: WFL  MUSCLE LENGTH: Hamstrings: good flexibility   POSTURE: No Significant postural limitations  PALPATION: TTP L1-L5 spinous and transverse processes, bilateral SIJ, pain with compression on sacrum  LUMBAR ROM:   AROM eval  Flexion WNL  Extension 50% with pain  Right lateral flexion WFL but pain at end range  Left lateral flexion WFL but pain at end range  Right rotation 75%  Left rotation 75% with pain   (Blank rows = not tested)  LOWER EXTREMITY ROM:  grossly WFL    LOWER EXTREMITY MMT:  grossly 5/5 LE's    LUMBAR SPECIAL TESTS:  Straight leg raise test: Negative, Slump test: Negative, FABER test: Positive, and Gaenslen's test: Positive  FUNCTIONAL TESTS:  5 times sit to stand: 10.97s   TODAY'S TREATMENT:                                                                                                                              DATE:   05/25/22 NuStep L4 x48mins Feet on pball x10 rotations and knees to chest  Bridges 2x10 Stretching HS, piriformis, SK2C 30s  STS 2x10 Stretching with pball x10 forwards and x10 rotations Calf stretch 30s x2 Calf raises 2x10  Shoulder ext 10# 2x10    05/14/22-EVAL     PATIENT EDUCATION:  Education details: POC and HEP Person educated: Patient Education method: Explanation Education comprehension: verbalized understanding  HOME EXERCISE PROGRAM: Access Code: [email or web address] URL: [email or web address] Date: 05/14/2022 Prepared by: Andris Baumann  Exercises - Supine Lower Trunk Rotation  - 1 x daily - 7 x weekly - 2 sets - 10 reps - Supine Bridge  - 1 x daily - 7 x weekly - 2 sets - 10 reps - Supine Double Knee to Chest Modified  - 1 x daily - 7 x weekly - 30 hold - Clamshell with Resistance  - 1 x daily - 7 x weekly - 2 sets - 10 reps  ASSESSMENT:  CLINICAL IMPRESSION: Patient reports no pain upon arrival. She has been doing more walking. We focused on some light mobility and functional strengthening. Patient demonstrates good flexibility with stretching. She is doing very well compared to eval, can increase difficulty with interventions.   OBJECTIVE IMPAIRMENTS: pain.   REHAB POTENTIAL: Good  CLINICAL DECISION MAKING: Stable/uncomplicated  EVALUATION COMPLEXITY: Low   GOALS: Goals reviewed with patient? Yes  SHORT TERM GOALS: Target date: 06/18/22  Patient will be independent with initial HEP.  Baseline: given 05/14/22 Goal status: INITIAL  2.  Patient will report centralization of radicular symptoms.  Baseline: radiates into LLE Goal status: INITIAL   LONG TERM GOALS: Target date: 07/23/22  Patient will be independent with advanced/ongoing HEP to improve outcomes and carryover.  Goal status: INITIAL  2.  Patient will report 75% improvement in low back pain to improve QOL.  Baseline: 8/10 at worst Goal status: INITIAL  3.  Patient will demonstrate  full pain free lumbar ROM to perform ADLs.   Baseline: see table above Goal status: INITIAL  4.  Patient will report 72 on lumbar FOTO to demonstrate improved functional ability.  Baseline: 63 Goal status: INITIAL   5.  Patient will be able to sweep and mop without an increase in back pain Baseline: pain with both activities Goal status: INITIAL   PLAN:  PT FREQUENCY: 1-2x/week  PT DURATION: 10 weeks  PLANNED INTERVENTIONS: Therapeutic exercises, Therapeutic activity, Neuromuscular re-education, Balance training, Gait training, Patient/Family education, Self Care, Joint mobilization, Dry Needling, Spinal manipulation, Spinal mobilization, Cryotherapy, Moist heat, Vasopneumatic device, Traction, Ionotophoresis 4mg /ml Dexamethasone, and Manual therapy.  PLAN FOR NEXT SESSION: core exercises    Andris Baumann, PT 05/25/2022, 10:14 AM

## 2022-05-25 ENCOUNTER — Ambulatory Visit: Payer: Medicare PPO | Attending: Family

## 2022-05-25 DIAGNOSIS — M6281 Muscle weakness (generalized): Secondary | ICD-10-CM | POA: Diagnosis present

## 2022-05-25 DIAGNOSIS — M5442 Lumbago with sciatica, left side: Secondary | ICD-10-CM | POA: Insufficient documentation

## 2022-05-25 DIAGNOSIS — M5416 Radiculopathy, lumbar region: Secondary | ICD-10-CM | POA: Diagnosis not present

## 2022-05-25 DIAGNOSIS — G8929 Other chronic pain: Secondary | ICD-10-CM | POA: Diagnosis present

## 2022-05-28 ENCOUNTER — Ambulatory Visit: Payer: Medicare PPO | Admitting: Physical Therapy

## 2022-05-28 ENCOUNTER — Encounter: Payer: Self-pay | Admitting: Physical Therapy

## 2022-05-28 DIAGNOSIS — G8929 Other chronic pain: Secondary | ICD-10-CM

## 2022-05-28 DIAGNOSIS — M6281 Muscle weakness (generalized): Secondary | ICD-10-CM

## 2022-05-28 DIAGNOSIS — M5416 Radiculopathy, lumbar region: Secondary | ICD-10-CM | POA: Diagnosis not present

## 2022-05-28 NOTE — Therapy (Signed)
OUTPATIENT PHYSICAL THERAPY THORACOLUMBAR TREATMENT   Patient Name: Kelli Flores MRN: [id number] DOB:[birth year], 72 y.o., female Today's Date: 05/28/2022  END OF SESSION:  PT End of Session - 05/28/22 1430     Visit Number 3    Date for PT Re-Evaluation 07/23/22    PT Start Time 1430    PT Stop Time 1515    PT Time Calculation (min) 45 min    Activity Tolerance Patient tolerated treatment well    Behavior During Therapy [facility] for tasks assessed/performed              Past Medical History:  Diagnosis Date   Hypertension    Past Surgical History:  Procedure Laterality Date   BREAST BIOPSY Right 05/20/2022   MM RT BREAST BX W LOC DEV 1ST LESION IMAGE BX SPEC STEREO GUIDE 05/20/2022 [facility]   IR ANGIO INTRA EXTRACRAN SEL COM CAROTID INNOMINATE UNI L MOD SED  11/17/2018   IR ANGIO VERTEBRAL SEL VERTEBRAL UNI L MOD SED  11/17/2018   IR GENERIC HISTORICAL  02/14/2016   IR ANGIO INTRA EXTRACRAN SEL COM CAROTID INNOMINATE BILAT MOD SED 02/14/2016 [name], MD [facility]   IR GENERIC HISTORICAL  02/14/2016   IR ANGIO VERTEBRAL SEL VERTEBRAL BILAT MOD SED 02/14/2016 [name], MD [facility]   IR RADIOLOGIST EVAL & MGMT  03/31/2017   There are no problems to display for this patient.   PCP: Novella Rob  REFERRING PROVIDER: Novella Rob  REFERRING DIAG:  M54.50 (ICD-10-CM) - Low back pain, unspecified      Rationale for Evaluation and Treatment: Rehabilitation  THERAPY DIAG:  Radiculopathy, lumbar region  Chronic left-sided low back pain with left-sided sciatica  Muscle weakness (generalized)  ONSET DATE: 05/01/22  SUBJECTIVE:                                                                                                                                                                                           SUBJECTIVE STATEMENT: "Doing ok" L hip pain is a constant but its not that bad  PERTINENT HISTORY:  No remarkable  history  PAIN:  Are you having pain? Yes: NPRS scale: 2/10 Pain location: L hip Pain description: dull, constant, ache Aggravating factors: stress,  Relieving factors: hot water bottle, movement helps  PRECAUTIONS: None  WEIGHT BEARING RESTRICTIONS: No  FALLS:  Has patient fallen in last 6 months? No  LIVING ENVIRONMENT: Lives with: lives alone Lives in: House/apartment Stairs: Yes: External: 6 steps; can reach both Has following equipment at home: None  OCCUPATION: Retired  PLOF: Independent  PATIENT GOALS: to alleviate the pain, fix  whatever is wrong    OBJECTIVE:   DIAGNOSTIC FINDINGS:  None for the back  PATIENT SURVEYS:  FOTO 63  SCREENING FOR RED FLAGS: Bowel or bladder incontinence: No Spinal tumors: No Cauda equina syndrome: No Compression fracture: No Abdominal aneurysm: No  COGNITION: Overall cognitive status: Within functional limits for tasks assessed     SENSATION: WFL  MUSCLE LENGTH: Hamstrings: good flexibility   POSTURE: No Significant postural limitations  PALPATION: TTP L1-L5 spinous and transverse processes, bilateral SIJ, pain with compression on sacrum  LUMBAR ROM:   AROM eval  Flexion WNL  Extension 50% with pain  Right lateral flexion WFL but pain at end range  Left lateral flexion WFL but pain at end range  Right rotation 75%  Left rotation 75% with pain   (Blank rows = not tested)  LOWER EXTREMITY ROM:  grossly WFL    LOWER EXTREMITY MMT:  grossly 5/5 LE's    LUMBAR SPECIAL TESTS:  Straight leg raise test: Negative, Slump test: Negative, FABER test: Positive, and Gaenslen's test: Positive  FUNCTIONAL TESTS:  5 times sit to stand: 10.97s   TODAY'S TREATMENT:                                                                                                                              DATE:  05/28/22 NuStep L4 x56mins Hip add ball squeeze 2x10 Hip abd green 2x10 S2S 2x10 Shoulder Ext & Rows green 2x10 Feet on  pball x10 rotations and knees to chest  Passive HS, Piriformis, Single and double K2C  05/25/22 NuStep L4 x27mins Feet on pball x10 rotations and knees to chest  Bridges 2x10 Stretching HS, piriformis, SK2C 30s  STS 2x10 Stretching with pball x10 forwards and x10 rotations Calf stretch 30s x2 Calf raises 2x10  Shoulder ext 10# 2x10    05/14/22-EVAL     PATIENT EDUCATION:  Education details: POC and HEP Person educated: Patient Education method: Explanation Education comprehension: verbalized understanding  HOME EXERCISE PROGRAM: Access Code: [email or web address] URL: [email or web address] Date: 05/14/2022 Prepared by: Andris Baumann  Exercises - Supine Lower Trunk Rotation  - 1 x daily - 7 x weekly - 2 sets - 10 reps - Supine Bridge  - 1 x daily - 7 x weekly - 2 sets - 10 reps - Supine Double Knee to Chest Modified  - 1 x daily - 7 x weekly - 30 hold - Clamshell with Resistance  - 1 x daily - 7 x weekly - 2 sets - 10 reps  ASSESSMENT:  CLINICAL IMPRESSION: Again patient reports no pain upon arrival. Focused on some light mobility and functional strengthening. Added standing shoulder Ext and Rows without issue.  Patient demonstrates good flexibility with stretching. Knee crepitus noted with sit to stand. No reports of increase pain during session.  OBJECTIVE IMPAIRMENTS: pain.   REHAB POTENTIAL: Good  CLINICAL DECISION MAKING: Stable/uncomplicated  EVALUATION COMPLEXITY: Low   GOALS: Goals reviewed with patient? Yes  SHORT TERM GOALS: Target date: 06/18/22  Patient will be independent with initial HEP.  Baseline: given 05/14/22 Goal status: INITIAL  2.  Patient will report centralization of radicular symptoms.  Baseline: radiates into LLE Goal status: INITIAL   LONG TERM GOALS: Target date: 07/23/22  Patient will be independent with advanced/ongoing HEP to improve outcomes and carryover.  Goal status: INITIAL  2.  Patient will report 75% improvement in low  back pain to improve QOL.  Baseline: 8/10 at worst Goal status: INITIAL  3.  Patient will demonstrate full pain free lumbar ROM to perform ADLs.   Baseline: see table above Goal status: INITIAL  4.  Patient will report 72 on lumbar FOTO to demonstrate improved functional ability.  Baseline: 63 Goal status: INITIAL   5.  Patient will be able to sweep and mop without an increase in back pain Baseline: pain with both activities Goal status: INITIAL   PLAN:  PT FREQUENCY: 1-2x/week  PT DURATION: 10 weeks  PLANNED INTERVENTIONS: Therapeutic exercises, Therapeutic activity, Neuromuscular re-education, Balance training, Gait training, Patient/Family education, Self Care, Joint mobilization, Dry Needling, Spinal manipulation, Spinal mobilization, Cryotherapy, Moist heat, Vasopneumatic device, Traction, Ionotophoresis 4mg /ml Dexamethasone, and Manual therapy.  PLAN FOR NEXT SESSION: core exercises    Scot Jun, PTA 05/28/2022, 2:31 PM

## 2022-06-01 NOTE — Therapy (Signed)
OUTPATIENT PHYSICAL THERAPY THORACOLUMBAR TREATMENT   Patient Name: Kelli SongJuliette Hanna Croson MRN: [id number] DOB:August 14, 1950, 72 y.o., female Today's Date: 06/02/2022  END OF SESSION:  PT End of Session - 06/02/22 1141     Visit Number 4    Date for PT Re-Evaluation 07/23/22    PT Start Time 1140    PT Stop Time 1225    PT Time Calculation (min) 45 min    Activity Tolerance Patient tolerated treatment well    Behavior During Therapy [facility] for tasks assessed/performed              Past Medical History:  Diagnosis Date   Hypertension    Past Surgical History:  Procedure Laterality Date   BREAST BIOPSY Right 05/20/2022   MM RT BREAST BX W LOC DEV 1ST LESION IMAGE BX SPEC STEREO GUIDE 05/20/2022 [facility]   IR ANGIO INTRA EXTRACRAN SEL COM CAROTID INNOMINATE UNI L MOD SED  11/17/2018   IR ANGIO VERTEBRAL SEL VERTEBRAL UNI L MOD SED  11/17/2018   IR GENERIC HISTORICAL  02/14/2016   IR ANGIO INTRA EXTRACRAN SEL COM CAROTID INNOMINATE BILAT MOD SED 02/14/2016 Julieanne CottonSanjeev Deveshwar, MD [facility]   IR GENERIC HISTORICAL  02/14/2016   IR ANGIO VERTEBRAL SEL VERTEBRAL BILAT MOD SED 02/14/2016 Julieanne CottonSanjeev Deveshwar, MD [facility]   IR RADIOLOGIST EVAL & MGMT  03/31/2017   There are no problems to display for this patient.   PCP: Baruch Goldmanneneda Daye  REFERRING PROVIDER: Baruch Goldmanneneda Daye  REFERRING DIAG:  M54.50 (ICD-10-CM) - Low back pain, unspecified      Rationale for Evaluation and Treatment: Rehabilitation  THERAPY DIAG:  Radiculopathy, lumbar region  Chronic left-sided low back pain with left-sided sciatica  Muscle weakness (generalized)  ONSET DATE: 05/01/22  SUBJECTIVE:                                                                                                                                                                                           SUBJECTIVE STATEMENT: Doing alright, I can feel the stiffness but in terms of pain I have not had to use my heat and the  pain is gone. It is amazing!   PERTINENT HISTORY:  No remarkable history  PAIN:  Are you having pain? Yes: NPRS scale: 0/10 Pain location: L hip Pain description: dull, constant, ache Aggravating factors: stress,  Relieving factors: hot water bottle, movement helps  PRECAUTIONS: None  WEIGHT BEARING RESTRICTIONS: No  FALLS:  Has patient fallen in last 6 months? No  LIVING ENVIRONMENT: Lives with: lives alone Lives in: House/apartment Stairs: Yes: External: 6 steps; can reach both Has following equipment at  home: None  OCCUPATION: Retired  PLOF: Independent  PATIENT GOALS: to alleviate the pain, fix whatever is wrong    OBJECTIVE:   DIAGNOSTIC FINDINGS:  None for the back  PATIENT SURVEYS:  FOTO 63  SCREENING FOR RED FLAGS: Bowel or bladder incontinence: No Spinal tumors: No Cauda equina syndrome: No Compression fracture: No Abdominal aneurysm: No  COGNITION: Overall cognitive status: Within functional limits for tasks assessed     SENSATION: WFL  MUSCLE LENGTH: Hamstrings: good flexibility   POSTURE: No Significant postural limitations  PALPATION: TTP L1-L5 spinous and transverse processes, bilateral SIJ, pain with compression on sacrum  LUMBAR ROM:   AROM eval  Flexion WNL  Extension 50% with pain  Right lateral flexion WFL but pain at end range  Left lateral flexion WFL but pain at end range  Right rotation 75%  Left rotation 75% with pain   (Blank rows = not tested)  LOWER EXTREMITY ROM:  grossly WFL    LOWER EXTREMITY MMT:  grossly 5/5 LE's    LUMBAR SPECIAL TESTS:  Straight leg raise test: Negative, Slump test: Negative, FABER test: Positive, and Gaenslen's test: Positive  FUNCTIONAL TESTS:  5 times sit to stand: 10.97s   TODAY'S TREATMENT:                                                                                                                              DATE:  06/02/22 NuStep L5 x75mins Walking outdoors big loop  around [employer] with band 2x10 Clamshells green 2x10  AB ball roll ups 2x10 Leg ext 10# 2x10 HS curls 20# 2x10 Leg press 20# 2x10   05/28/22 NuStep L4 x58mins Hip add ball squeeze 2x10 Hip abd green 2x10 S2S 2x10 Shoulder Ext & Rows green 2x10 Feet on pball x10 rotations and knees to chest  Passive HS, Piriformis, Single and double K2C  05/25/22 NuStep L4 x60mins Feet on pball x10 rotations and knees to chest  Bridges 2x10 Stretching HS, piriformis, SK2C 30s  STS 2x10 Stretching with pball x10 forwards and x10 rotations Calf stretch 30s x2 Calf raises 2x10  Shoulder ext 10# 2x10    05/14/22-EVAL     PATIENT EDUCATION:  Education details: POC and HEP Person educated: Patient Education method: Explanation Education comprehension: verbalized understanding  HOME EXERCISE PROGRAM: Access Code: [email or web address] URL: [email or web address] Date: 05/14/2022 Prepared by: Cassie Freer  Exercises - Supine Lower Trunk Rotation  - 1 x daily - 7 x weekly - 2 sets - 10 reps - Supine Bridge  - 1 x daily - 7 x weekly - 2 sets - 10 reps - Supine Double Knee to Chest Modified  - 1 x daily - 7 x weekly - 30 hold - Clamshell with Resistance  - 1 x daily - 7 x weekly - 2 sets - 10 reps  ASSESSMENT:  CLINICAL IMPRESSION: Patient is doing really well. She is surprised that she is not having pain anymore after just  a few visits. States she is able to get up in the mornings without hurting or feeling slow. We progressed to work on strengthening on the machines today. Was educated on possible soreness to follow in the next few days.   OBJECTIVE IMPAIRMENTS: pain.   REHAB POTENTIAL: Good  CLINICAL DECISION MAKING: Stable/uncomplicated  EVALUATION COMPLEXITY: Low   GOALS: Goals reviewed with patient? Yes  SHORT TERM GOALS: Target date: 06/18/22  Patient will be independent with initial HEP.  Baseline: given 05/14/22 Goal status: MET  2.  Patient will report  centralization of radicular symptoms.  Baseline: radiates into LLE Goal status: MET   LONG TERM GOALS: Target date: 07/23/22  Patient will be independent with advanced/ongoing HEP to improve outcomes and carryover.  Goal status: INITIAL  2.  Patient will report 75% improvement in low back pain to improve QOL.  Baseline: 8/10 at worst Goal status: INITIAL  3.  Patient will demonstrate full pain free lumbar ROM to perform ADLs.   Baseline: see table above Goal status: INITIAL  4.  Patient will report 72 on lumbar FOTO to demonstrate improved functional ability.  Baseline: 63 Goal status: INITIAL   5.  Patient will be able to sweep and mop without an increase in back pain Baseline: pain with both activities Goal status: INITIAL   PLAN:  PT FREQUENCY: 1-2x/week  PT DURATION: 10 weeks  PLANNED INTERVENTIONS: Therapeutic exercises, Therapeutic activity, Neuromuscular re-education, Balance training, Gait training, Patient/Family education, Self Care, Joint mobilization, Dry Needling, Spinal manipulation, Spinal mobilization, Cryotherapy, Moist heat, Vasopneumatic device, Traction, Ionotophoresis 4mg /ml Dexamethasone, and Manual therapy.  PLAN FOR NEXT SESSION: core exercises    Cassie Freer, PT 06/02/2022, 12:21 PM

## 2022-06-02 ENCOUNTER — Ambulatory Visit: Payer: Medicare PPO

## 2022-06-02 DIAGNOSIS — G8929 Other chronic pain: Secondary | ICD-10-CM

## 2022-06-02 DIAGNOSIS — M5416 Radiculopathy, lumbar region: Secondary | ICD-10-CM | POA: Diagnosis not present

## 2022-06-02 DIAGNOSIS — M6281 Muscle weakness (generalized): Secondary | ICD-10-CM

## 2022-06-05 ENCOUNTER — Ambulatory Visit: Payer: Medicare PPO

## 2022-06-05 DIAGNOSIS — M6281 Muscle weakness (generalized): Secondary | ICD-10-CM

## 2022-06-05 DIAGNOSIS — M5416 Radiculopathy, lumbar region: Secondary | ICD-10-CM

## 2022-06-05 DIAGNOSIS — G8929 Other chronic pain: Secondary | ICD-10-CM

## 2022-06-05 NOTE — Therapy (Signed)
OUTPATIENT PHYSICAL THERAPY THORACOLUMBAR TREATMENT   Patient Name: Kelli Flores MRN: [id number] DOB:[birth year], 72 y.o., female Today's Date: 06/05/2022  END OF SESSION:  PT End of Session - 06/05/22 0953     Visit Number 5    Date for PT Re-Evaluation 07/23/22    PT Start Time 0934    PT Stop Time 1015    PT Time Calculation (min) 41 min    Activity Tolerance Patient tolerated treatment well    Behavior During Therapy [facility] for tasks assessed/performed               Past Medical History:  Diagnosis Date   Hypertension    Past Surgical History:  Procedure Laterality Date   BREAST BIOPSY Right 05/20/2022   MM RT BREAST BX W LOC DEV 1ST LESION IMAGE BX SPEC STEREO GUIDE 05/20/2022 [facility]   IR ANGIO INTRA EXTRACRAN SEL COM CAROTID INNOMINATE UNI L MOD SED  11/17/2018   IR ANGIO VERTEBRAL SEL VERTEBRAL UNI L MOD SED  11/17/2018   IR GENERIC HISTORICAL  02/14/2016   IR ANGIO INTRA EXTRACRAN SEL COM CAROTID INNOMINATE BILAT MOD SED 02/14/2016 Julieanne Cotton, MD [facility]   IR GENERIC HISTORICAL  02/14/2016   IR ANGIO VERTEBRAL SEL VERTEBRAL BILAT MOD SED 02/14/2016 Julieanne Cotton, MD [facility]   IR RADIOLOGIST EVAL & MGMT  03/31/2017   There are no problems to display for this patient.   PCP: Baruch Goldmann  REFERRING PROVIDER: Baruch Goldmann  REFERRING DIAG:  M54.50 (ICD-10-CM) - Low back pain, unspecified      Rationale for Evaluation and Treatment: Rehabilitation  THERAPY DIAG:  Radiculopathy, lumbar region  Chronic left-sided low back pain with left-sided sciatica  Muscle weakness (generalized)  ONSET DATE: 05/01/22  SUBJECTIVE:                                                                                                                                                                                           SUBJECTIVE STATEMENT: "Last night, for the first time since coming, my left lower back started hurting in the middle of  the night. I'm not sure what I did. The ache is always there, but not hurting today."  PERTINENT HISTORY:  No remarkable history  PAIN:  Are you having pain? Yes: NPRS scale: 0/10 Pain location: L hip Pain description: dull, constant, ache Aggravating factors: stress,  Relieving factors: hot water bottle, movement helps  PRECAUTIONS: None  WEIGHT BEARING RESTRICTIONS: No  FALLS:  Has patient fallen in last 6 months? No  LIVING ENVIRONMENT: Lives with: lives alone Lives in: House/apartment Stairs: Yes: External: 6 steps;  can reach both Has following equipment at home: None  OCCUPATION: Retired  PLOF: Independent  PATIENT GOALS: to alleviate the pain, fix whatever is wrong    OBJECTIVE:   DIAGNOSTIC FINDINGS:  None for the back  PATIENT SURVEYS:  FOTO 63  SCREENING FOR RED FLAGS: Bowel or bladder incontinence: No Spinal tumors: No Cauda equina syndrome: No Compression fracture: No Abdominal aneurysm: No  COGNITION: Overall cognitive status: Within functional limits for tasks assessed     SENSATION: WFL  MUSCLE LENGTH: Hamstrings: good flexibility   POSTURE: No Significant postural limitations  PALPATION: TTP L1-L5 spinous and transverse processes, bilateral SIJ, pain with compression on sacrum  LUMBAR ROM:   AROM eval  Flexion WNL  Extension 50% with pain  Right lateral flexion WFL but pain at end range  Left lateral flexion WFL but pain at end range  Right rotation 75%  Left rotation 75% with pain   (Blank rows = not tested)  LOWER EXTREMITY ROM:  grossly WFL    LOWER EXTREMITY MMT:  grossly 5/5 LE's    LUMBAR SPECIAL TESTS:  Straight leg raise test: Negative, Slump test: Negative, FABER test: Positive, and Gaenslen's test: Positive  FUNCTIONAL TESTS:  5 times sit to stand: 10.97s   TODAY'S TREATMENT:                                                                                                                              DATE:   [facility] Adult PT Treatment:                                                DATE: 06/05/22 Therapeutic Exercise: Warm up walk down the hill to the bottom of the parking lot and back up at a brisk pace Bridge with green loop x10 Sidelying clams with green loop 2x10 B Mini Bridge + march x3 B slowly with tactile feedback to left knee/hip to increase stability STS from lowest table setting with green loop above knees, focusing on eccentric control x10 then holding squat and pulsing x10 Forward fold at counter: extending one knee and flexing the other 2 Rolldowns/rollups in standing  06/02/22 NuStep L5 x75mins Walking outdoors big loop around [employer] with band 2x10 Clamshells green 2x10  AB ball roll ups 2x10 Leg ext 10# 2x10 HS curls 20# 2x10 Leg press 20# 2x10   05/28/22 NuStep L4 x22mins Hip add ball squeeze 2x10 Hip abd green 2x10 S2S 2x10 Shoulder Ext & Rows green 2x10 Feet on pball x10 rotations and knees to chest  Passive HS, Piriformis, Single and double [facility]  05/25/22 NuStep L4 x69mins Feet on pball x10 rotations and knees to chest  Bridges 2x10 Stretching HS, piriformis, SK2C 30s  STS 2x10 Stretching with pball x10 forwards and x10 rotations Calf stretch 30s x2 Calf raises  2x10  Shoulder ext 10# 2x10    05/14/22-EVAL     PATIENT EDUCATION:  Education details: POC and HEP Person educated: Patient Education method: Explanation Education comprehension: verbalized understanding  HOME EXERCISE PROGRAM: Access Code: [email or web address] URL: [email or web address] Date: 05/14/2022 Prepared by: Cassie Freer  Exercises - Supine Lower Trunk Rotation  - 1 x daily - 7 x weekly - 2 sets - 10 reps - Supine Bridge  - 1 x daily - 7 x weekly - 2 sets - 10 reps - Supine Double Knee to Chest Modified  - 1 x daily - 7 x weekly - 30 hold - Clamshell with Resistance  - 1 x daily - 7 x weekly - 2 sets - 10 reps  ASSESSMENT:  CLINICAL IMPRESSION: Patient presents with no  pain today, but did have one bout of pain overnight for the first time since starting PT. She tolerated today's session well with no adverse reactions. Added supine bridge hold and march with pt difficulty lifting RLE initially due to left proximal hip and core weakness. She is making good progress and plans to focus on HEP, while away from PT next week. She would continue to benefit from further core/hip strength progressions.   OBJECTIVE IMPAIRMENTS: pain.   REHAB POTENTIAL: Good  CLINICAL DECISION MAKING: Stable/uncomplicated  EVALUATION COMPLEXITY: Low   GOALS: Goals reviewed with patient? Yes  SHORT TERM GOALS: Target date: 06/18/22  Patient will be independent with initial HEP.  Baseline: given 05/14/22 Goal status: MET  2.  Patient will report centralization of radicular symptoms.  Baseline: radiates into LLE Goal status: MET   LONG TERM GOALS: Target date: 07/23/22  Patient will be independent with advanced/ongoing HEP to improve outcomes and carryover.  Goal status: INITIAL  2.  Patient will report 75% improvement in low back pain to improve QOL.  Baseline: 8/10 at worst Goal status: INITIAL  3.  Patient will demonstrate full pain free lumbar ROM to perform ADLs.   Baseline: see table above Goal status: INITIAL  4.  Patient will report 72 on lumbar FOTO to demonstrate improved functional ability.  Baseline: 63 Goal status: INITIAL   5.  Patient will be able to sweep and mop without an increase in back pain Baseline: pain with both activities Goal status: INITIAL   PLAN:  PT FREQUENCY: 1-2x/week  PT DURATION: 10 weeks  PLANNED INTERVENTIONS: Therapeutic exercises, Therapeutic activity, Neuromuscular re-education, Balance training, Gait training, Patient/Family education, Self Care, Joint mobilization, Dry Needling, Spinal manipulation, Spinal mobilization, Cryotherapy, Moist heat, Vasopneumatic device, Traction, Ionotophoresis /ml Dexamethasone, and Manual  therapy.  PLAN FOR NEXT SESSION: core stabilization and proximal hip stability   Marcelline Mates, PT, DPT 06/05/2022, 12:07 PM

## 2022-06-09 ENCOUNTER — Ambulatory Visit: Payer: Medicare PPO

## 2022-06-11 ENCOUNTER — Ambulatory Visit: Payer: Medicare PPO

## 2022-06-16 ENCOUNTER — Encounter: Payer: Self-pay | Admitting: Physical Therapy

## 2022-06-16 ENCOUNTER — Ambulatory Visit: Payer: Medicare PPO | Admitting: Physical Therapy

## 2022-06-16 DIAGNOSIS — G8929 Other chronic pain: Secondary | ICD-10-CM

## 2022-06-16 DIAGNOSIS — M5416 Radiculopathy, lumbar region: Secondary | ICD-10-CM

## 2022-06-16 DIAGNOSIS — M6281 Muscle weakness (generalized): Secondary | ICD-10-CM

## 2022-06-16 NOTE — Therapy (Signed)
OUTPATIENT PHYSICAL THERAPY THORACOLUMBAR TREATMENT   Patient Name: Kelli Flores MRN: [id number] DOB:07/13/1950, 72 y.o., female Today's Date: 06/16/2022  END OF SESSION:  PT End of Session - 06/16/22 1148     Visit Number 6    Date for PT Re-Evaluation 07/23/22    PT Start Time 1145    PT Stop Time 1230    PT Time Calculation (min) 45 min    Activity Tolerance Patient tolerated treatment well    Behavior During Therapy [facility] for tasks assessed/performed               Past Medical History:  Diagnosis Date   Hypertension    Past Surgical History:  Procedure Laterality Date   BREAST BIOPSY Right 05/20/2022   MM RT BREAST BX W LOC DEV 1ST LESION IMAGE BX SPEC STEREO GUIDE 05/20/2022 [facility]   IR ANGIO INTRA EXTRACRAN SEL COM CAROTID INNOMINATE UNI L MOD SED  11/17/2018   IR ANGIO VERTEBRAL SEL VERTEBRAL UNI L MOD SED  11/17/2018   IR GENERIC HISTORICAL  02/14/2016   IR ANGIO INTRA EXTRACRAN SEL COM CAROTID INNOMINATE BILAT MOD SED 02/14/2016 Julieanne Cotton, MD [facility]   IR GENERIC HISTORICAL  02/14/2016   IR ANGIO VERTEBRAL SEL VERTEBRAL BILAT MOD SED 02/14/2016 Julieanne Cotton, MD [facility]   IR RADIOLOGIST EVAL & MGMT  03/31/2017   There are no problems to display for this patient.   PCP: Baruch Goldmann  REFERRING PROVIDER: Baruch Goldmann  REFERRING DIAG:  M54.50 (ICD-10-CM) - Low back pain, unspecified      Rationale for Evaluation and Treatment: Rehabilitation  THERAPY DIAG:  Radiculopathy, lumbar region  Chronic left-sided low back pain with left-sided sciatica  Muscle weakness (generalized)  ONSET DATE: 05/01/22  SUBJECTIVE:                                                                                                                                                                                           SUBJECTIVE STATEMENT: "I feel good today"  PERTINENT HISTORY:  No remarkable history  PAIN:  Are you having pain?  Yes: NPRS scale: 3/10 Pain location: L hip Pain description: dull, constant, ache Aggravating factors: stress,  Relieving factors: hot water bottle, movement helps  PRECAUTIONS: None  WEIGHT BEARING RESTRICTIONS: No  FALLS:  Has patient fallen in last 6 months? No  LIVING ENVIRONMENT: Lives with: lives alone Lives in: House/apartment Stairs: Yes: External: 6 steps; can reach both Has following equipment at home: None  OCCUPATION: Retired  PLOF: Independent  PATIENT GOALS: to alleviate the pain, fix whatever is wrong    OBJECTIVE:  DIAGNOSTIC FINDINGS:  None for the back  PATIENT SURVEYS:  FOTO 63  SCREENING FOR RED FLAGS: Bowel or bladder incontinence: No Spinal tumors: No Cauda equina syndrome: No Compression fracture: No Abdominal aneurysm: No  COGNITION: Overall cognitive status: Within functional limits for tasks assessed     SENSATION: WFL  MUSCLE LENGTH: Hamstrings: good flexibility   POSTURE: No Significant postural limitations  PALPATION: TTP L1-L5 spinous and transverse processes, bilateral SIJ, pain with compression on sacrum  LUMBAR ROM:   AROM eval 06/17/22  Flexion WNL WFL  Extension 50% with pain Limited 25% with pain  Right lateral flexion WFL but pain at end range [facility] w/ pain  Left lateral flexion WFL but pain at end range [facility] w/ Pain  Right rotation 75% Limited 50%  Left rotation 75% with pain Liited 50%   (Blank rows = not tested)  LOWER EXTREMITY ROM:  grossly WFL    LOWER EXTREMITY MMT:  grossly 5/5 LE's    LUMBAR SPECIAL TESTS:  Straight leg raise test: Negative, Slump test: Negative, FABER test: Positive, and Gaenslen's test: Positive  FUNCTIONAL TESTS:  5 times sit to stand: 10.97s   TODAY'S TREATMENT:                                                                                                                              DATE:  06/16/22 NuStep L5 x 6 min S2S OHP w/ yellow ball 2x10 Seated rows & Lats 20lb  2x10 Seated lumbar Ext black band 2x10 HS curls 20lb 2x10 Leg Ext 5lb 2x10 Passive HS, Piriformis, Single and double K2C, lower trunk rotations Bridges  LE on Pball bridges, oblq, K2C  [facility] Adult PT Treatment:                                                DATE: 06/05/22 Therapeutic Exercise: Warm up walk down the hill to the bottom of the parking lot and back up at a brisk pace Bridge with green loop x10 Sidelying clams with green loop 2x10 B Mini Bridge + march x3 B slowly with tactile feedback to left knee/hip to increase stability STS from lowest table setting with green loop above knees, focusing on eccentric control x10 then holding squat and pulsing x10 Forward fold at counter: extending one knee and flexing the other 2 Rolldowns/rollups in standing  06/02/22 NuStep L5 x74mins Walking outdoors big loop around [employer] with band 2x10 Clamshells green 2x10  AB ball roll ups 2x10 Leg ext 10# 2x10 HS curls 20# 2x10 Leg press 20# 2x10   05/28/22 NuStep L4 x31mins Hip add ball squeeze 2x10 Hip abd green 2x10 S2S 2x10 Shoulder Ext & Rows green 2x10 Feet on pball x10 rotations and knees to chest  Passive HS, Piriformis, Single and double [facility]  05/25/22 NuStep L4  x42mins Feet on pball x10 rotations and knees to chest  Bridges 2x10 Stretching HS, piriformis, SK2C 30s  STS 2x10 Stretching with pball x10 forwards and x10 rotations Calf stretch 30s x2 Calf raises 2x10  Shoulder ext 10# 2x10    05/14/22-EVAL     PATIENT EDUCATION:  Education details: POC and HEP Person educated: Patient Education method: Explanation Education comprehension: verbalized understanding  HOME EXERCISE PROGRAM: Access Code: [email or web address] URL: [email or web address] Date: 05/14/2022 Prepared by: Cassie Freer  Exercises - Supine Lower Trunk Rotation  - 1 x daily - 7 x weekly - 2 sets - 10 reps - Supine Bridge  - 1 x daily - 7 x weekly - 2 sets - 10 reps - Supine Double Knee to  Chest Modified  - 1 x daily - 7 x weekly - 30 hold - Clamshell with Resistance  - 1 x daily - 7 x weekly - 2 sets - 10 reps  ASSESSMENT:  CLINICAL IMPRESSION: Patient presents with litte pain today. She has progressed increasing her lumbar ROM some but continues to have pain. Introduced pt to more machine level interventions without issue. Good motion with stretches. Some tissue density in the lumbar para spinales with palpation.  No pain post session.  OBJECTIVE IMPAIRMENTS: pain.   REHAB POTENTIAL: Good  CLINICAL DECISION MAKING: Stable/uncomplicated  EVALUATION COMPLEXITY: Low   GOALS: Goals reviewed with patient? Yes  SHORT TERM GOALS: Target date: 06/18/22  Patient will be independent with initial HEP.  Baseline: given 05/14/22 Goal status: MET  2.  Patient will report centralization of radicular symptoms.  Baseline: radiates into LLE Goal status: MET   LONG TERM GOALS: Target date: 07/23/22  Patient will be independent with advanced/ongoing HEP to improve outcomes and carryover.  Goal status: INITIAL  2.  Patient will report 75% improvement in low back pain to improve QOL.  Baseline: 8/10 at worst Goal status: INITIAL  3.  Patient will demonstrate full pain free lumbar ROM to perform ADLs.   Baseline: see table above Goal status: Progressing  4.  Patient will report 72 on lumbar FOTO to demonstrate improved functional ability.  Baseline: 63 Goal status: INITIAL   5.  Patient will be able to sweep and mop without an increase in back pain Baseline: pain with both activities Goal status: ongoing   PLAN:  PT FREQUENCY: 1-2x/week  PT DURATION: 10 weeks  PLANNED INTERVENTIONS: Therapeutic exercises, Therapeutic activity, Neuromuscular re-education, Balance training, Gait training, Patient/Family education, Self Care, Joint mobilization, Dry Needling, Spinal manipulation, Spinal mobilization, Cryotherapy, Moist heat, Vasopneumatic device, Traction,  Ionotophoresis 4mg /ml Dexamethasone, and Manual therapy.  PLAN FOR NEXT SESSION: core stabilization and proximal hip stability   Grayce Sessions, PTA,  06/16/2022, 11:48 AM

## 2022-06-18 ENCOUNTER — Ambulatory Visit: Payer: Medicare PPO | Admitting: Physical Therapy

## 2022-06-18 ENCOUNTER — Encounter: Payer: Self-pay | Admitting: Physical Therapy

## 2022-06-18 DIAGNOSIS — M5416 Radiculopathy, lumbar region: Secondary | ICD-10-CM

## 2022-06-18 DIAGNOSIS — G8929 Other chronic pain: Secondary | ICD-10-CM

## 2022-06-18 DIAGNOSIS — M6281 Muscle weakness (generalized): Secondary | ICD-10-CM

## 2022-06-18 NOTE — Therapy (Signed)
OUTPATIENT PHYSICAL THERAPY THORACOLUMBAR TREATMENT   Patient Name: Kelli Flores MRN: [id number] DOB:12/05/1950, 72 y.o., female Today's Date: 06/18/2022  END OF SESSION:  PT End of Session - 06/18/22 0938     Visit Number 7    Date for PT Re-Evaluation 07/23/22    PT Start Time 0938    PT Stop Time 1015    PT Time Calculation (min) 37 min    Activity Tolerance Patient tolerated treatment well    Behavior During Therapy [facility] for tasks assessed/performed               Past Medical History:  Diagnosis Date   Hypertension    Past Surgical History:  Procedure Laterality Date   BREAST BIOPSY Right 05/20/2022   MM RT BREAST BX W LOC DEV 1ST LESION IMAGE BX SPEC STEREO GUIDE 05/20/2022 [facility]   IR ANGIO INTRA EXTRACRAN SEL COM CAROTID INNOMINATE UNI L MOD SED  11/17/2018   IR ANGIO VERTEBRAL SEL VERTEBRAL UNI L MOD SED  11/17/2018   IR GENERIC HISTORICAL  02/14/2016   IR ANGIO INTRA EXTRACRAN SEL COM CAROTID INNOMINATE BILAT MOD SED 02/14/2016 Julieanne Cotton, MD [facility]   IR GENERIC HISTORICAL  02/14/2016   IR ANGIO VERTEBRAL SEL VERTEBRAL BILAT MOD SED 02/14/2016 Julieanne Cotton, MD [facility]   IR RADIOLOGIST EVAL & MGMT  03/31/2017   There are no problems to display for this patient.   PCP: Baruch Goldmann  REFERRING PROVIDER: Baruch Goldmann  REFERRING DIAG:  M54.50 (ICD-10-CM) - Low back pain, unspecified      Rationale for Evaluation and Treatment: Rehabilitation  THERAPY DIAG:  Radiculopathy, lumbar region  Muscle weakness (generalized)  Chronic left-sided low back pain with left-sided sciatica  ONSET DATE: 05/01/22  SUBJECTIVE:                                                                                                                                                                                           SUBJECTIVE STATEMENT: "Pretty good"  PERTINENT HISTORY:  No remarkable history  PAIN:  Are you having pain? Yes:  NPRS scale: 0/10 Pain location: L hip Pain description: dull, constant, ache Aggravating factors: stress,  Relieving factors: hot water bottle, movement helps  PRECAUTIONS: None  WEIGHT BEARING RESTRICTIONS: No  FALLS:  Has patient fallen in last 6 months? No  LIVING ENVIRONMENT: Lives with: lives alone Lives in: House/apartment Stairs: Yes: External: 6 steps; can reach both Has following equipment at home: None  OCCUPATION: Retired  PLOF: Independent  PATIENT GOALS: to alleviate the pain, fix whatever is wrong    OBJECTIVE:   DIAGNOSTIC  FINDINGS:  None for the back  PATIENT SURVEYS:  FOTO 63  SCREENING FOR RED FLAGS: Bowel or bladder incontinence: No Spinal tumors: No Cauda equina syndrome: No Compression fracture: No Abdominal aneurysm: No  COGNITION: Overall cognitive status: Within functional limits for tasks assessed     SENSATION: WFL  MUSCLE LENGTH: Hamstrings: good flexibility   POSTURE: No Significant postural limitations  PALPATION: TTP L1-L5 spinous and transverse processes, bilateral SIJ, pain with compression on sacrum  LUMBAR ROM:   AROM eval 06/17/22  Flexion WNL WFL  Extension 50% with pain Limited 25% with pain  Right lateral flexion WFL but pain at end range [facility] w/ pain  Left lateral flexion WFL but pain at end range [facility] w/ Pain  Right rotation 75% Limited 50%  Left rotation 75% with pain Liited 50%   (Blank rows = not tested)  LOWER EXTREMITY ROM:  grossly WFL    LOWER EXTREMITY MMT:  grossly 5/5 LE's    LUMBAR SPECIAL TESTS:  Straight leg raise test: Negative, Slump test: Negative, FABER test: Positive, and Gaenslen's test: Positive  FUNCTIONAL TESTS:  5 times sit to stand: 10.97s   TODAY'S TREATMENT:                                                                                                                              DATE:  4/225/23 Twp laps around widest section of back parking lot Seated rows & Lats 20lb  2x10  Spine bridges  LE on Pball bridges, oblq, K2C Passive HS, Piriformis, Single and double K2C, lower trunk rotations  06/16/22 NuStep L5 x 6 min S2S OHP w/ yellow ball 2x10 Seated rows & Lats 20lb 2x10 Seated lumbar Ext black band 2x10 HS curls 20lb 2x10 Leg Ext 5lb 2x10 Passive HS, Piriformis, Single and double K2C, lower trunk rotations Bridges  LE on Pball bridges, oblq, K2C  [facility] Adult PT Treatment:                                                DATE: 06/05/22 Therapeutic Exercise: Warm up walk down the hill to the bottom of the parking lot and back up at a brisk pace Bridge with green loop x10 Sidelying clams with green loop 2x10 B Mini Bridge + march x3 B slowly with tactile feedback to left knee/hip to increase stability STS from lowest table setting with green loop above knees, focusing on eccentric control x10 then holding squat and pulsing x10 Forward fold at counter: extending one knee and flexing the other 2 Rolldowns/rollups in standing  06/02/22 NuStep L5 x19mins Walking outdoors big loop around [employer] with band 2x10 Clamshells green 2x10  AB ball roll ups 2x10 Leg ext 10# 2x10 HS curls 20# 2x10 Leg press 20# 2x10   05/28/22 NuStep L4 x19mins Hip add  ball squeeze 2x10 Hip abd green 2x10 S2S 2x10 Shoulder Ext & Rows green 2x10 Feet on pball x10 rotations and knees to chest  Passive HS, Piriformis, Single and double K2C  05/25/22 NuStep L4 x63mins Feet on pball x10 rotations and knees to chest  Bridges 2x10 Stretching HS, piriformis, SK2C 30s  STS 2x10 Stretching with pball x10 forwards and x10 rotations Calf stretch 30s x2 Calf raises 2x10  Shoulder ext 10# 2x10    05/14/22-EVAL     PATIENT EDUCATION:  Education details: POC and HEP Person educated: Patient Education method: Explanation Education comprehension: verbalized understanding  HOME EXERCISE PROGRAM: Access Code: [email or web address] URL: [email or web address] Date:  05/14/2022 Prepared by: Cassie Freer  Exercises - Supine Lower Trunk Rotation  - 1 x daily - 7 x weekly - 2 sets - 10 reps - Supine Bridge  - 1 x daily - 7 x weekly - 2 sets - 10 reps - Supine Double Knee to Chest Modified  - 1 x daily - 7 x weekly - 30 hold - Clamshell with Resistance  - 1 x daily - 7 x weekly - 2 sets - 10 reps  ASSESSMENT:  CLINICAL IMPRESSION: Patient presents with no pain today. Added outdoor ambulation up and down slopes. Decrease gait speed with uphill ambulation. Cues for core engagement needed with seated rows and supine bridges,. Good motion with stretches. Some tissue density in the lumbar para spinales with palpation.  No pain post session.  OBJECTIVE IMPAIRMENTS: pain.   REHAB POTENTIAL: Good  CLINICAL DECISION MAKING: Stable/uncomplicated  EVALUATION COMPLEXITY: Low   GOALS: Goals reviewed with patient? Yes  SHORT TERM GOALS: Target date: 06/18/22  Patient will be independent with initial HEP.  Baseline: given 05/14/22 Goal status: MET  2.  Patient will report centralization of radicular symptoms.  Baseline: radiates into LLE Goal status: MET   LONG TERM GOALS: Target date: 07/23/22  Patient will be independent with advanced/ongoing HEP to improve outcomes and carryover.  Goal status: INITIAL  2.  Patient will report 75% improvement in low back pain to improve QOL.  Baseline: 8/10 at worst Goal status: Progressing   3.  Patient will demonstrate full pain free lumbar ROM to perform ADLs.   Baseline: see table above Goal status: Progressing  4.  Patient will report 72 on lumbar FOTO to demonstrate improved functional ability.  Baseline: 63 Goal status: INITIAL   5.  Patient will be able to sweep and mop without an increase in back pain Baseline: pain with both activities Goal status: ongoing   PLAN:  PT FREQUENCY: 1-2x/week  PT DURATION: 10 weeks  PLANNED INTERVENTIONS: Therapeutic exercises, Therapeutic activity, Neuromuscular  re-education, Balance training, Gait training, Patient/Family education, Self Care, Joint mobilization, Dry Needling, Spinal manipulation, Spinal mobilization, Cryotherapy, Moist heat, Vasopneumatic device, Traction, Ionotophoresis 4mg /ml Dexamethasone, and Manual therapy.  PLAN FOR NEXT SESSION: core stabilization and proximal hip stability   Grayce Sessions, PTA,  06/18/2022, 9:38 AM

## 2022-06-23 ENCOUNTER — Encounter: Payer: Self-pay | Admitting: Physical Therapy

## 2022-06-23 ENCOUNTER — Ambulatory Visit: Payer: Medicare PPO | Admitting: Physical Therapy

## 2022-06-23 DIAGNOSIS — M5416 Radiculopathy, lumbar region: Secondary | ICD-10-CM | POA: Diagnosis not present

## 2022-06-23 DIAGNOSIS — M6281 Muscle weakness (generalized): Secondary | ICD-10-CM

## 2022-06-23 DIAGNOSIS — G8929 Other chronic pain: Secondary | ICD-10-CM

## 2022-06-23 NOTE — Therapy (Signed)
OUTPATIENT PHYSICAL THERAPY THORACOLUMBAR TREATMENT   Patient Name: Kelli Flores MRN: [id number] DOB:[birth year], 72 y.o., female Today's Date: 06/23/2022  END OF SESSION:  PT End of Session - 06/23/22 1144     Visit Number 8    Date for PT Re-Evaluation 07/23/22    PT Start Time 1145    PT Stop Time 1230    PT Time Calculation (min) 45 min    Activity Tolerance Patient tolerated treatment well    Behavior During Therapy [facility] for tasks assessed/performed               Past Medical History:  Diagnosis Date   Hypertension    Past Surgical History:  Procedure Laterality Date   BREAST BIOPSY Right 05/20/2022   MM RT BREAST BX W LOC DEV 1ST LESION IMAGE BX SPEC STEREO GUIDE 05/20/2022 [facility]   IR ANGIO INTRA EXTRACRAN SEL COM CAROTID INNOMINATE UNI L MOD SED  11/17/2018   IR ANGIO VERTEBRAL SEL VERTEBRAL UNI L MOD SED  11/17/2018   IR GENERIC HISTORICAL  02/14/2016   IR ANGIO INTRA EXTRACRAN SEL COM CAROTID INNOMINATE BILAT MOD SED 02/14/2016 Julieanne Cotton, MD [facility]   IR GENERIC HISTORICAL  02/14/2016   IR ANGIO VERTEBRAL SEL VERTEBRAL BILAT MOD SED 02/14/2016 Julieanne Cotton, MD [facility]   IR RADIOLOGIST EVAL & MGMT  03/31/2017   There are no problems to display for this patient.   PCP: Baruch Goldmann  REFERRING PROVIDER: Baruch Goldmann  REFERRING DIAG:  M54.50 (ICD-10-CM) - Low back pain, unspecified      Rationale for Evaluation and Treatment: Rehabilitation  THERAPY DIAG:  Radiculopathy, lumbar region  Muscle weakness (generalized)  Chronic left-sided low back pain with left-sided sciatica  ONSET DATE: 05/01/22  SUBJECTIVE:                                                                                                                                                                                           SUBJECTIVE STATEMENT: "Pretty good"  PERTINENT HISTORY:  No remarkable history  PAIN:  Are you having pain? Yes:  NPRS scale: 3/10 Pain location: L hip Pain description: dull, constant, ache Aggravating factors: stress,  Relieving factors: hot water bottle, movement helps  PRECAUTIONS: None  WEIGHT BEARING RESTRICTIONS: No  FALLS:  Has patient fallen in last 6 months? No  LIVING ENVIRONMENT: Lives with: lives alone Lives in: House/apartment Stairs: Yes: External: 6 steps; can reach both Has following equipment at home: None  OCCUPATION: Retired  PLOF: Independent  PATIENT GOALS: to alleviate the pain, fix whatever is wrong    OBJECTIVE:   DIAGNOSTIC  FINDINGS:  None for the back  PATIENT SURVEYS:  FOTO 63  SCREENING FOR RED FLAGS: Bowel or bladder incontinence: No Spinal tumors: No Cauda equina syndrome: No Compression fracture: No Abdominal aneurysm: No  COGNITION: Overall cognitive status: Within functional limits for tasks assessed     SENSATION: WFL  MUSCLE LENGTH: Hamstrings: good flexibility   POSTURE: No Significant postural limitations  PALPATION: TTP L1-L5 spinous and transverse processes, bilateral SIJ, pain with compression on sacrum  LUMBAR ROM:   AROM eval 06/17/22  Flexion WNL WFL  Extension 50% with pain Limited 25% with pain  Right lateral flexion WFL but pain at end range [facility] w/ pain  Left lateral flexion WFL but pain at end range [facility] w/ Pain  Right rotation 75% Limited 50%  Left rotation 75% with pain Liited 50%   (Blank rows = not tested)  LOWER EXTREMITY ROM:  grossly WFL    LOWER EXTREMITY MMT:  grossly 5/5 LE's    LUMBAR SPECIAL TESTS:  Straight leg raise test: Negative, Slump test: Negative, FABER test: Positive, and Gaenslen's test: Positive  FUNCTIONAL TESTS:  5 times sit to stand: 10.97s   TODAY'S TREATMENT:                                                                                                                              DATE:  06/23/22 NuStep L5 x 7 min Seated rows & Lats 20lb 2x10 HS curls 20lb 2x10 Leg Ext  5lb 2x10 LE on Pball bridges, oblq, K2C Passive HS, Piriformis, Single and double K2C, lower trunk rotations  4/225/23 Twp laps around widest section of back parking lot Seated rows & Lats 20lb 2x10  Spine bridges  LE on Pball bridges, oblq, K2C Passive HS, Piriformis, Single and double K2C, lower trunk rotations  06/16/22 NuStep L5 x 6 min S2S OHP w/ yellow ball 2x10 Seated rows & Lats 20lb 2x10 Seated lumbar Ext black band 2x10 HS curls 20lb 2x10 Leg Ext 5lb 2x10 Passive HS, Piriformis, Single and double K2C, lower trunk rotations Bridges  LE on Pball bridges, oblq, K2C  [facility] Adult PT Treatment:                                                DATE: 06/05/22 Therapeutic Exercise: Warm up walk down the hill to the bottom of the parking lot and back up at a brisk pace Bridge with green loop x10 Sidelying clams with green loop 2x10 B Mini Bridge + march x3 B slowly with tactile feedback to left knee/hip to increase stability STS from lowest table setting with green loop above knees, focusing on eccentric control x10 then holding squat and pulsing x10 Forward fold at counter: extending one knee and flexing the other 2 Rolldowns/rollups in standing  06/02/22 NuStep L5 x44mins Walking outdoors  big loop around building Bridges with band 2x10 Clamshells green 2x10  AB ball roll ups 2x10 Leg ext 10# 2x10 HS curls 20# 2x10 Leg press 20# 2x10   05/28/22 NuStep L4 x38mins Hip add ball squeeze 2x10 Hip abd green 2x10 S2S 2x10 Shoulder Ext & Rows green 2x10 Feet on pball x10 rotations and knees to chest  Passive HS, Piriformis, Single and double K2C     05/14/22-EVAL     PATIENT EDUCATION:  Education details: POC and HEP Person educated: Patient Education method: Explanation Education comprehension: verbalized understanding  HOME EXERCISE PROGRAM: Access Code: [email or web address] URL: [email or web address] Date: 05/14/2022 Prepared by: Cassie Freer  Exercises - Supine  Lower Trunk Rotation  - 1 x daily - 7 x weekly - 2 sets - 10 reps - Supine Bridge  - 1 x daily - 7 x weekly - 2 sets - 10 reps - Supine Double Knee to Chest Modified  - 1 x daily - 7 x weekly - 30 hold - Clamshell with Resistance  - 1 x daily - 7 x weekly - 2 sets - 10 reps  ASSESSMENT:  CLINICAL IMPRESSION: Patient presents with no pain today.  Cues for core engagement needed with seated rows and supine bridges. Cues for full ROM needed with leg curls and extensions. Good motion with stretches. Some tissue density in the lumbar para spinales with palpation.  No pain post session.  OBJECTIVE IMPAIRMENTS: pain.   REHAB POTENTIAL: Good  CLINICAL DECISION MAKING: Stable/uncomplicated  EVALUATION COMPLEXITY: Low   GOALS: Goals reviewed with patient? Yes  SHORT TERM GOALS: Target date: 06/18/22  Patient will be independent with initial HEP.  Baseline: given 05/14/22 Goal status: MET  2.  Patient will report centralization of radicular symptoms.  Baseline: radiates into LLE Goal status: MET   LONG TERM GOALS: Target date: 07/23/22  Patient will be independent with advanced/ongoing HEP to improve outcomes and carryover.  Goal status: INITIAL  2.  Patient will report 75% improvement in low back pain to improve QOL.  Baseline: 8/10 at worst Goal status: Progressing   3.  Patient will demonstrate full pain free lumbar ROM to perform ADLs.   Baseline: see table above Goal status: Progressing  4.  Patient will report 72 on lumbar FOTO to demonstrate improved functional ability.  Baseline: 63 Goal status: INITIAL   5.  Patient will be able to sweep and mop without an increase in back pain Baseline: pain with both activities Goal status: ongoing   PLAN:  PT FREQUENCY: 1-2x/week  PT DURATION: 10 weeks  PLANNED INTERVENTIONS: Therapeutic exercises, Therapeutic activity, Neuromuscular re-education, Balance training, Gait training, Patient/Family education, Self Care, Joint  mobilization, Dry Needling, Spinal manipulation, Spinal mobilization, Cryotherapy, Moist heat, Vasopneumatic device, Traction, Ionotophoresis 4mg /ml Dexamethasone, and Manual therapy.  PLAN FOR NEXT SESSION: core stabilization and proximal hip stability   Grayce Sessions, PTA,  06/23/2022, 11:45 AM

## 2022-07-01 NOTE — Therapy (Signed)
OUTPATIENT PHYSICAL THERAPY THORACOLUMBAR TREATMENT   Patient Name: Kelli Flores MRN: [id number] DOB:[birth year], 72 y.o., female Today's Date: 07/02/2022  END OF SESSION:  PT End of Session - 07/02/22 1013     Visit Number 9    Date for PT Re-Evaluation 07/23/22    PT Start Time 1015    PT Stop Time 1100    PT Time Calculation (min) 45 min    Activity Tolerance Patient tolerated treatment well    Behavior During Therapy [facility] for tasks assessed/performed                Past Medical History:  Diagnosis Date   Hypertension    Past Surgical History:  Procedure Laterality Date   BREAST BIOPSY Right 05/20/2022   MM RT BREAST BX W LOC DEV 1ST LESION IMAGE BX SPEC STEREO GUIDE 05/20/2022 [facility]   IR ANGIO INTRA EXTRACRAN SEL COM CAROTID INNOMINATE UNI L MOD SED  11/17/2018   IR ANGIO VERTEBRAL SEL VERTEBRAL UNI L MOD SED  11/17/2018   IR GENERIC HISTORICAL  02/14/2016   IR ANGIO INTRA EXTRACRAN SEL COM CAROTID INNOMINATE BILAT MOD SED 02/14/2016 Julieanne Cotton, MD [facility]   IR GENERIC HISTORICAL  02/14/2016   IR ANGIO VERTEBRAL SEL VERTEBRAL BILAT MOD SED 02/14/2016 Julieanne Cotton, MD [facility]   IR RADIOLOGIST EVAL & MGMT  03/31/2017   There are no problems to display for this patient.   PCP: Baruch Goldmann  REFERRING PROVIDER: Baruch Goldmann  REFERRING DIAG:  M54.50 (ICD-10-CM) - Low back pain, unspecified      Rationale for Evaluation and Treatment: Rehabilitation  THERAPY DIAG:  Radiculopathy, lumbar region  Muscle weakness (generalized)  Chronic left-sided low back pain with left-sided sciatica  ONSET DATE: 05/01/22  SUBJECTIVE:                                                                                                                                                                                           SUBJECTIVE STATEMENT: I am tired, my back is hurting because I have not been able to do any exercises since my brother  passed on Aug 18, 2022.   PERTINENT HISTORY:  No remarkable history  PAIN:  Are you having pain? Yes: NPRS scale: 3/10 Pain location: L hip Pain description: dull, constant, ache Aggravating factors: stress,  Relieving factors: hot water bottle, movement helps  PRECAUTIONS: None  WEIGHT BEARING RESTRICTIONS: No  FALLS:  Has patient fallen in last 6 months? No  LIVING ENVIRONMENT: Lives with: lives alone Lives in: House/apartment Stairs: Yes: External: 6 steps; can reach both Has following equipment at home: None  OCCUPATION: Retired  PLOF: Independent  PATIENT GOALS: to alleviate the pain, fix whatever is wrong    OBJECTIVE:   DIAGNOSTIC FINDINGS:  None for the back  PATIENT SURVEYS:  FOTO 63  SCREENING FOR RED FLAGS: Bowel or bladder incontinence: No Spinal tumors: No Cauda equina syndrome: No Compression fracture: No Abdominal aneurysm: No  COGNITION: Overall cognitive status: Within functional limits for tasks assessed     SENSATION: WFL  MUSCLE LENGTH: Hamstrings: good flexibility   POSTURE: No Significant postural limitations  PALPATION: TTP L1-L5 spinous and transverse processes, bilateral SIJ, pain with compression on sacrum  LUMBAR ROM:   AROM eval 06/17/22  Flexion WNL WFL  Extension 50% with pain Limited 25% with pain  Right lateral flexion WFL but pain at end range [facility] w/ pain  Left lateral flexion WFL but pain at end range [facility] w/ Pain  Right rotation 75% Limited 50%  Left rotation 75% with pain Liited 50%   (Blank rows = not tested)  LOWER EXTREMITY ROM:  grossly WFL    LOWER EXTREMITY MMT:  grossly 5/5 LE's    LUMBAR SPECIAL TESTS:  Straight leg raise test: Negative, Slump test: Negative, FABER test: Positive, and Gaenslen's test: Positive  FUNCTIONAL TESTS:  5 times sit to stand: 10.97s   TODAY'S TREATMENT:                                                                                                                               DATE:  07/02/22 NuStep L5 x105mins  Cable rows 10# 2x10 Shoulder ext 10# 2x10 AR press 10# 2x10  Step ups with airex on top 6" x10 each side  Calf stretch 30s Rows and Lats 25# 2x10 blackTB ext 2x10 BlackTB crunches 2x10  Leg press 20# 3x10    06/23/22 NuStep L5 x 7 min Seated rows & Lats 20lb 2x10 HS curls 20lb 2x10 Leg Ext 5lb 2x10 LE on Pball bridges, oblq, K2C Passive HS, Piriformis, Single and double K2C, lower trunk rotations  4/225/23 Twp laps around widest section of back parking lot Seated rows & Lats 20lb 2x10  Spine bridges  LE on Pball bridges, oblq, K2C Passive HS, Piriformis, Single and double K2C, lower trunk rotations  06/16/22 NuStep L5 x 6 min S2S OHP w/ yellow ball 2x10 Seated rows & Lats 20lb 2x10 Seated lumbar Ext black band 2x10 HS curls 20lb 2x10 Leg Ext 5lb 2x10 Passive HS, Piriformis, Single and double K2C, lower trunk rotations Bridges  LE on Pball bridges, oblq, K2C  [facility] Adult PT Treatment:                                                DATE: 06/05/22 Therapeutic Exercise: Warm up walk down the hill to the bottom of the parking lot and back up at  a brisk pace Bridge with green loop x10 Sidelying clams with green loop 2x10 B Mini Bridge + march x3 B slowly with tactile feedback to left knee/hip to increase stability STS from lowest table setting with green loop above knees, focusing on eccentric control x10 then holding squat and pulsing x10 Forward fold at counter: extending one knee and flexing the other 2 Rolldowns/rollups in standing  06/02/22 NuStep L5 x42mins Walking outdoors big loop around [employer] with band 2x10 Clamshells green 2x10  AB ball roll ups 2x10 Leg ext 10# 2x10 HS curls 20# 2x10 Leg press 20# 2x10   05/28/22 NuStep L4 x32mins Hip add ball squeeze 2x10 Hip abd green 2x10 S2S 2x10 Shoulder Ext & Rows green 2x10 Feet on pball x10 rotations and knees to chest  Passive HS, Piriformis, Single and double  K2C     05/14/22-EVAL     PATIENT EDUCATION:  Education details: POC and HEP Person educated: Patient Education method: Explanation Education comprehension: verbalized understanding  HOME EXERCISE PROGRAM: Access Code: [email or web address] URL: [email or web address] Date: 05/14/2022 Prepared by: Cassie Freer  Exercises - Supine Lower Trunk Rotation  - 1 x daily - 7 x weekly - 2 sets - 10 reps - Supine Bridge  - 1 x daily - 7 x weekly - 2 sets - 10 reps - Supine Double Knee to Chest Modified  - 1 x daily - 7 x weekly - 30 hold - Clamshell with Resistance  - 1 x daily - 7 x weekly - 2 sets - 10 reps  ASSESSMENT:  CLINICAL IMPRESSION: Patient presents with some pain today.  Focused on back and core strengthening. Cues for core engagement. Does well with all exercises, no c/o of pain throughout session.    OBJECTIVE IMPAIRMENTS: pain.   REHAB POTENTIAL: Good  CLINICAL DECISION MAKING: Stable/uncomplicated  EVALUATION COMPLEXITY: Low   GOALS: Goals reviewed with patient? Yes  SHORT TERM GOALS: Target date: 06/18/22  Patient will be independent with initial HEP.  Baseline: given 05/14/22 Goal status: MET  2.  Patient will report centralization of radicular symptoms.  Baseline: radiates into LLE Goal status: MET   LONG TERM GOALS: Target date: 07/23/22  Patient will be independent with advanced/ongoing HEP to improve outcomes and carryover.  Goal status: INITIAL  2.  Patient will report 75% improvement in low back pain to improve QOL.  Baseline: 8/10 at worst Goal status: Progressing   3.  Patient will demonstrate full pain free lumbar ROM to perform ADLs.   Baseline: see table above Goal status: Progressing  4.  Patient will report 72 on lumbar FOTO to demonstrate improved functional ability.  Baseline: 63 Goal status: INITIAL   5.  Patient will be able to sweep and mop without an increase in back pain Baseline: pain with both activities Goal status:  ongoing   PLAN:  PT FREQUENCY: 1-2x/week  PT DURATION: 10 weeks  PLANNED INTERVENTIONS: Therapeutic exercises, Therapeutic activity, Neuromuscular re-education, Balance training, Gait training, Patient/Family education, Self Care, Joint mobilization, Dry Needling, Spinal manipulation, Spinal mobilization, Cryotherapy, Moist heat, Vasopneumatic device, Traction, Ionotophoresis 4mg /ml Dexamethasone, and Manual therapy.  PLAN FOR NEXT SESSION: core stabilization and proximal hip stability   Cassie Freer, PT,  07/02/2022, 10:54 AM

## 2022-07-02 ENCOUNTER — Ambulatory Visit: Payer: Medicare PPO | Attending: Family

## 2022-07-02 DIAGNOSIS — M6281 Muscle weakness (generalized): Secondary | ICD-10-CM | POA: Diagnosis present

## 2022-07-02 DIAGNOSIS — M5416 Radiculopathy, lumbar region: Secondary | ICD-10-CM | POA: Insufficient documentation

## 2022-07-02 DIAGNOSIS — M5442 Lumbago with sciatica, left side: Secondary | ICD-10-CM | POA: Insufficient documentation

## 2022-07-02 DIAGNOSIS — G8929 Other chronic pain: Secondary | ICD-10-CM | POA: Insufficient documentation

## 2022-07-07 ENCOUNTER — Ambulatory Visit: Payer: Medicare PPO | Admitting: Physical Therapy

## 2022-07-09 ENCOUNTER — Ambulatory Visit: Payer: Medicare PPO | Admitting: Physical Therapy

## 2022-07-09 ENCOUNTER — Encounter: Payer: Self-pay | Admitting: Physical Therapy

## 2022-07-09 DIAGNOSIS — M6281 Muscle weakness (generalized): Secondary | ICD-10-CM

## 2022-07-09 DIAGNOSIS — M5416 Radiculopathy, lumbar region: Secondary | ICD-10-CM

## 2022-07-09 DIAGNOSIS — G8929 Other chronic pain: Secondary | ICD-10-CM

## 2022-07-09 NOTE — Therapy (Signed)
OUTPATIENT PHYSICAL THERAPY THORACOLUMBAR TREATMENT Progress Note Reporting Period 05/14/22 to 07/09/22  See note below for Objective Data and Assessment of Progress/Goals.      Patient Name: Kelli Flores MRN: [id number] DOB:05/31/1950, 72 y.o., female Today's Date: 07/09/2022  END OF SESSION:  PT End of Session - 07/09/22 1019     Visit Number 10    Date for PT Re-Evaluation 07/23/22    PT Start Time 1015    PT Stop Time 1100    PT Time Calculation (min) 45 min    Activity Tolerance Patient tolerated treatment well    Behavior During Therapy [facility] for tasks assessed/performed                Past Medical History:  Diagnosis Date   Hypertension    Past Surgical History:  Procedure Laterality Date   BREAST BIOPSY Right 05/20/2022   MM RT BREAST BX W LOC DEV 1ST LESION IMAGE BX SPEC STEREO GUIDE 05/20/2022 [facility]   IR ANGIO INTRA EXTRACRAN SEL COM CAROTID INNOMINATE UNI L MOD SED  11/17/2018   IR ANGIO VERTEBRAL SEL VERTEBRAL UNI L MOD SED  11/17/2018   IR GENERIC HISTORICAL  02/14/2016   IR ANGIO INTRA EXTRACRAN SEL COM CAROTID INNOMINATE BILAT MOD SED 02/14/2016 Julieanne Cotton, MD [facility]   IR GENERIC HISTORICAL  02/14/2016   IR ANGIO VERTEBRAL SEL VERTEBRAL BILAT MOD SED 02/14/2016 Julieanne Cotton, MD [facility]   IR RADIOLOGIST EVAL & MGMT  03/31/2017   There are no problems to display for this patient.   PCP: Baruch Goldmann  REFERRING PROVIDER: Baruch Goldmann  REFERRING DIAG:  M54.50 (ICD-10-CM) - Low back pain, unspecified      Rationale for Evaluation and Treatment: Rehabilitation  THERAPY DIAG:  Radiculopathy, lumbar region  Muscle weakness (generalized)  Chronic left-sided low back pain with left-sided sciatica  ONSET DATE: 05/01/22  SUBJECTIVE:                                                                                                                                                                                            SUBJECTIVE STATEMENT: Moving slow, had some death in the family. [employer]  PERTINENT HISTORY:  No remarkable history  PAIN:  Are you having pain? Yes: NPRS scale: 3/10 Pain location: L hip Pain description: dull, constant, ache Aggravating factors: stress,  Relieving factors: hot water bottle, movement helps  PRECAUTIONS: None  WEIGHT BEARING RESTRICTIONS: No  FALLS:  Has patient fallen in last 6 months? No  LIVING ENVIRONMENT: Lives with: lives alone Lives in: House/apartment Stairs: Yes: External: 6 steps; can reach both Has  following equipment at home: None  OCCUPATION: Retired  PLOF: Independent  PATIENT GOALS: to alleviate the pain, fix whatever is wrong    OBJECTIVE:   DIAGNOSTIC FINDINGS:  None for the back  PATIENT SURVEYS:  FOTO 63  SCREENING FOR RED FLAGS: Bowel or bladder incontinence: No Spinal tumors: No Cauda equina syndrome: No Compression fracture: No Abdominal aneurysm: No  COGNITION: Overall cognitive status: Within functional limits for tasks assessed     SENSATION: WFL  MUSCLE LENGTH: Hamstrings: good flexibility   POSTURE: No Significant postural limitations  PALPATION: TTP L1-L5 spinous and transverse processes, bilateral SIJ, pain with compression on sacrum  LUMBAR ROM:   AROM eval 06/17/22 07/09/22  Flexion WNL WFL WFL  Extension 50% with pain Limited 25% with pain Limited 25%  Right lateral flexion WFL but pain at end range [facility] w/ pain WFL  Left lateral flexion WFL but pain at end range [facility] w/ Pain WFL  Right rotation 75% Limited 50% WFL  Left rotation 75% with pain Liited 50% WFL   (Blank rows = not tested)  LOWER EXTREMITY ROM:  grossly WFL    LOWER EXTREMITY MMT:  grossly 5/5 LE's    LUMBAR SPECIAL TESTS:  Straight leg raise test: Negative, Slump test: Negative, FABER test: Positive, and Gaenslen's test: Positive  FUNCTIONAL TESTS:  5 times sit to stand: 10.97s   TODAY'S TREATMENT:                                                                                                                               DATE:  07/09/22 NuStep L5 x27mins  CHECKED GOALS Shoulder ext 10# 2x10 Front and lateral step ups 4in box on airex. Rows and Lats 20# 3x10 blackTB ext 3x10 S2S OHP 4lb dumbbell  07/02/22 NuStep L5 x79mins  Cable rows 10# 2x10 Shoulder ext 10# 2x10 AR press 10# 2x10  Step ups with airex on top 6" x10 each side  Calf stretch 30s Rows and Lats 25# 2x10 blackTB ext 2x10 BlackTB crunches 2x10  Leg press 20# 3x10    06/23/22 NuStep L5 x 7 min Seated rows & Lats 20lb 2x10 HS curls 20lb 2x10 Leg Ext 5lb 2x10 LE on Pball bridges, oblq, K2C Passive HS, Piriformis, Single and double K2C, lower trunk rotations  4/225/23 Twp laps around widest section of back parking lot Seated rows & Lats 20lb 2x10  Spine bridges  LE on Pball bridges, oblq, K2C Passive HS, Piriformis, Single and double K2C, lower trunk rotations  06/16/22 NuStep L5 x 6 min S2S OHP w/ yellow ball 2x10 Seated rows & Lats 20lb 2x10 Seated lumbar Ext black band 2x10 HS curls 20lb 2x10 Leg Ext 5lb 2x10 Passive HS, Piriformis, Single and double K2C, lower trunk rotations Bridges  LE on Pball bridges, oblq, K2C  [facility] Adult PT Treatment:  DATE: 06/05/22 Therapeutic Exercise: Warm up walk down the hill to the bottom of the parking lot and back up at a brisk pace Bridge with green loop x10 Sidelying clams with green loop 2x10 B Mini Bridge + march x3 B slowly with tactile feedback to left knee/hip to increase stability STS from lowest table setting with green loop above knees, focusing on eccentric control x10 then holding squat and pulsing x10 Forward fold at counter: extending one knee and flexing the other 2 Rolldowns/rollups in standing  06/02/22 NuStep L5 x77mins Walking outdoors big loop around [employer] with band 2x10 Clamshells green 2x10  AB ball roll ups  2x10 Leg ext 10# 2x10 HS curls 20# 2x10 Leg press 20# 2x10   05/28/22 NuStep L4 x28mins Hip add ball squeeze 2x10 Hip abd green 2x10 S2S 2x10 Shoulder Ext & Rows green 2x10 Feet on pball x10 rotations and knees to chest  Passive HS, Piriformis, Single and double K2C     05/14/22-EVAL     PATIENT EDUCATION:  Education details: POC and HEP Person educated: Patient Education method: Explanation Education comprehension: verbalized understanding  HOME EXERCISE PROGRAM: Access Code: [email or web address] URL: [email or web address] Date: 05/14/2022 Prepared by: Cassie Freer  Exercises - Supine Lower Trunk Rotation  - 1 x daily - 7 x weekly - 2 sets - 10 reps - Supine Bridge  - 1 x daily - 7 x weekly - 2 sets - 10 reps - Supine Double Knee to Chest Modified  - 1 x daily - 7 x weekly - 30 hold - Clamshell with Resistance  - 1 x daily - 7 x weekly - 2 sets - 10 reps  ASSESSMENT:  CLINICAL IMPRESSION: Pt has progressed to and met some LTG. Lumbar ROM is good overall, but still lacks some lumbar extension. She continues to report pain with sweeping and vacuuming but less overall. Focused on back and core strengthening. Cues for core engagement. Does well with all exercises, no c/o of pain throughout session.    OBJECTIVE IMPAIRMENTS: pain.   REHAB POTENTIAL: Good  CLINICAL DECISION MAKING: Stable/uncomplicated  EVALUATION COMPLEXITY: Low   GOALS: Goals reviewed with patient? Yes  SHORT TERM GOALS: Target date: 06/18/22  Patient will be independent with initial HEP.  Baseline: given 05/14/22 Goal status: MET  2.  Patient will report centralization of radicular symptoms.  Baseline: radiates into LLE Goal status: MET   LONG TERM GOALS: Target date: 07/23/22  Patient will be independent with advanced/ongoing HEP to improve outcomes and carryover.  Goal status: Progressing 07/09/22  2.  Patient will report 75% improvement in low back pain to improve QOL.  Baseline:  8/10 at worst Goal status: Progressing   3.  Patient will demonstrate full pain free lumbar ROM to perform ADLs.   Baseline: see table above Goal status: Progressing 07/09/22  4.  Patient will report 72 on lumbar FOTO to demonstrate improved functional ability.  Baseline: 63 Goal status: Met 83 07/08/22   5.  Patient will be able to sweep and mop without an increase in back pain Baseline: pain with both activities Goal status: Progressing Still has some pain 07/09/22   PLAN:  PT FREQUENCY: 1-2x/week  PT DURATION: 10 weeks  PLANNED INTERVENTIONS: Therapeutic exercises, Therapeutic activity, Neuromuscular re-education, Balance training, Gait training, Patient/Family education, Self Care, Joint mobilization, Dry Needling, Spinal manipulation, Spinal mobilization, Cryotherapy, Moist heat, Vasopneumatic device, Traction, Ionotophoresis 4mg /ml Dexamethasone, and Manual therapy.  PLAN FOR NEXT SESSION: core stabilization and proximal hip stability  Grayce Sessions, PTA,  07/09/2022, 10:19 AM

## 2022-07-14 ENCOUNTER — Ambulatory Visit: Payer: Medicare PPO | Admitting: Physical Therapy

## 2022-07-14 ENCOUNTER — Encounter: Payer: Self-pay | Admitting: Physical Therapy

## 2022-07-14 DIAGNOSIS — M5416 Radiculopathy, lumbar region: Secondary | ICD-10-CM

## 2022-07-14 DIAGNOSIS — M6281 Muscle weakness (generalized): Secondary | ICD-10-CM

## 2022-07-14 DIAGNOSIS — G8929 Other chronic pain: Secondary | ICD-10-CM

## 2022-07-14 NOTE — Therapy (Signed)
OUTPATIENT PHYSICAL THERAPY THORACOLUMBAR TREATMENT       Patient Name: Kelli Flores MRN: [id number] DOB:1950/12/24, 72 y.o., female Today's Date: 07/14/2022  END OF SESSION:  PT End of Session - 07/14/22 1015     Visit Number 11    Date for PT Re-Evaluation 07/23/22    PT Start Time 1015    PT Stop Time 1100    PT Time Calculation (min) 45 min    Activity Tolerance Patient tolerated treatment well    Behavior During Therapy [facility] for tasks assessed/performed                Past Medical History:  Diagnosis Date   Hypertension    Past Surgical History:  Procedure Laterality Date   BREAST BIOPSY Right 05/20/2022   MM RT BREAST BX W LOC DEV 1ST LESION IMAGE BX SPEC STEREO GUIDE 05/20/2022 [facility]   IR ANGIO INTRA EXTRACRAN SEL COM CAROTID INNOMINATE UNI L MOD SED  11/17/2018   IR ANGIO VERTEBRAL SEL VERTEBRAL UNI L MOD SED  11/17/2018   IR GENERIC HISTORICAL  02/14/2016   IR ANGIO INTRA EXTRACRAN SEL COM CAROTID INNOMINATE BILAT MOD SED 02/14/2016 Julieanne Cotton, MD [facility]   IR GENERIC HISTORICAL  02/14/2016   IR ANGIO VERTEBRAL SEL VERTEBRAL BILAT MOD SED 02/14/2016 Julieanne Cotton, MD [facility]   IR RADIOLOGIST EVAL & MGMT  03/31/2017   There are no problems to display for this patient.   PCP: Baruch Goldmann  REFERRING PROVIDER: Baruch Goldmann  REFERRING DIAG:  M54.50 (ICD-10-CM) - Low back pain, unspecified      Rationale for Evaluation and Treatment: Rehabilitation  THERAPY DIAG:  Radiculopathy, lumbar region  Muscle weakness (generalized)  Chronic left-sided low back pain with left-sided sciatica  ONSET DATE: 05/01/22  SUBJECTIVE:                                                                                                                                                                                           SUBJECTIVE STATEMENT: "Feeling good today" Not much sleep  PERTINENT HISTORY:  No remarkable history  PAIN:   Are you having pain? Yes: NPRS scale: 3/10 Pain location: L hip posterior  Pain description: dull, constant, ache Aggravating factors: stress,  Relieving factors: hot water bottle, movement helps  PRECAUTIONS: None  WEIGHT BEARING RESTRICTIONS: No  FALLS:  Has patient fallen in last 6 months? No  LIVING ENVIRONMENT: Lives with: lives alone Lives in: House/apartment Stairs: Yes: External: 6 steps; can reach both Has following equipment at home: None  OCCUPATION: Retired  PLOF: Independent  PATIENT GOALS: to alleviate the pain,  fix whatever is wrong    OBJECTIVE:   DIAGNOSTIC FINDINGS:  None for the back  PATIENT SURVEYS:  FOTO 63  SCREENING FOR RED FLAGS: Bowel or bladder incontinence: No Spinal tumors: No Cauda equina syndrome: No Compression fracture: No Abdominal aneurysm: No  COGNITION: Overall cognitive status: Within functional limits for tasks assessed     SENSATION: WFL  MUSCLE LENGTH: Hamstrings: good flexibility   POSTURE: No Significant postural limitations  PALPATION: TTP L1-L5 spinous and transverse processes, bilateral SIJ, pain with compression on sacrum  LUMBAR ROM:   AROM eval 06/17/22 07/09/22  Flexion WNL WFL WFL  Extension 50% with pain Limited 25% with pain Limited 25%  Right lateral flexion WFL but pain at end range [facility] w/ pain WFL  Left lateral flexion WFL but pain at end range [facility] w/ Pain WFL  Right rotation 75% Limited 50% WFL  Left rotation 75% with pain Liited 50% WFL   (Blank rows = not tested)  LOWER EXTREMITY ROM:  grossly WFL    LOWER EXTREMITY MMT:  grossly 5/5 LE's    LUMBAR SPECIAL TESTS:  Straight leg raise test: Negative, Slump test: Negative, FABER test: Positive, and Gaenslen's test: Positive  FUNCTIONAL TESTS:  5 times sit to stand: 10.97s   TODAY'S TREATMENT:                                                                                                                              DATE:   07/14/22 One laps around widest section of back parking lot Resisted gait 30lb all directions x4 each Shoulder 5lb 2x15  Rows & Lats 25lb 2x10  blackTB ext 2x10 Leg press 30lb 2x15   07/09/22 NuStep L5 x77mins  CHECKED GOALS Shoulder ext 10# 2x10 Front and lateral step ups 4in box on airex. Rows and Lats 20# 3x10 blackTB ext 3x10 S2S OHP 4lb dumbbell  07/02/22 NuStep L5 x36mins  Cable rows 10# 2x10 Shoulder ext 10# 2x10 AR press 10# 2x10  Step ups with airex on top 6" x10 each side  Calf stretch 30s Rows and Lats 25# 2x10 blackTB ext 2x10 BlackTB crunches 2x10  Leg press 20# 3x10    06/23/22 NuStep L5 x 7 min Seated rows & Lats 20lb 2x10 HS curls 20lb 2x10 Leg Ext 5lb 2x10 LE on Pball bridges, oblq, K2C Passive HS, Piriformis, Single and double K2C, lower trunk rotations  4/225/23 Twp laps around widest section of back parking lot Seated rows & Lats 20lb 2x10  Spine bridges  LE on Pball bridges, oblq, K2C Passive HS, Piriformis, Single and double K2C, lower trunk rotations  06/16/22 NuStep L5 x 6 min S2S OHP w/ yellow ball 2x10 Seated rows & Lats 20lb 2x10 Seated lumbar Ext black band 2x10 HS curls 20lb 2x10 Leg Ext 5lb 2x10 Passive HS, Piriformis, Single and double K2C, lower trunk rotations Bridges  LE on Pball bridges, oblq, K2C  [facility] Adult PT Treatment:  DATE: 06/05/22 Therapeutic Exercise: Warm up walk down the hill to the bottom of the parking lot and back up at a brisk pace Bridge with green loop x10 Sidelying clams with green loop 2x10 B Mini Bridge + march x3 B slowly with tactile feedback to left knee/hip to increase stability STS from lowest table setting with green loop above knees, focusing on eccentric control x10 then holding squat and pulsing x10 Forward fold at counter: extending one knee and flexing the other 2 Rolldowns/rollups in standing  06/02/22 NuStep L5 x30mins Walking outdoors big loop  around [employer] with band 2x10 Clamshells green 2x10  AB ball roll ups 2x10 Leg ext 10# 2x10 HS curls 20# 2x10 Leg press 20# 2x10   05/28/22 NuStep L4 x50mins Hip add ball squeeze 2x10 Hip abd green 2x10 S2S 2x10 Shoulder Ext & Rows green 2x10 Feet on pball x10 rotations and knees to chest  Passive HS, Piriformis, Single and double K2C     05/14/22-EVAL     PATIENT EDUCATION:  Education details: POC and HEP Person educated: Patient Education method: Explanation Education comprehension: verbalized understanding  HOME EXERCISE PROGRAM: Access Code: [email or web address] URL: [email or web address] Date: 05/14/2022 Prepared by: Cassie Freer  Exercises - Supine Lower Trunk Rotation  - 1 x daily - 7 x weekly - 2 sets - 10 reps - Supine Bridge  - 1 x daily - 7 x weekly - 2 sets - 10 reps - Supine Double Knee to Chest Modified  - 1 x daily - 7 x weekly - 30 hold - Clamshell with Resistance  - 1 x daily - 7 x weekly - 2 sets - 10 reps  ASSESSMENT:  CLINICAL IMPRESSION: Ptis progressing well. Good pace with outdoor ambulation up and own slope. Hip fatigue and burning reported during resisted side steps.ion. Focused on back and core strengthening with machine level interventions. Cues for core engagement with shoulder extensions. Does well with all exercises, no c/o of pain throughout session.    OBJECTIVE IMPAIRMENTS: pain.   REHAB POTENTIAL: Good  CLINICAL DECISION MAKING: Stable/uncomplicated  EVALUATION COMPLEXITY: Low   GOALS: Goals reviewed with patient? Yes  SHORT TERM GOALS: Target date: 06/18/22  Patient will be independent with initial HEP.  Baseline: given 05/14/22 Goal status: MET  2.  Patient will report centralization of radicular symptoms.  Baseline: radiates into LLE Goal status: MET   LONG TERM GOALS: Target date: 07/23/22  Patient will be independent with advanced/ongoing HEP to improve outcomes and carryover.  Goal status: Progressing  07/09/22  2.  Patient will report 75% improvement in low back pain to improve QOL.  Baseline: 8/10 at worst Goal status: Progressing   3.  Patient will demonstrate full pain free lumbar ROM to perform ADLs.   Baseline: see table above Goal status: Progressing 07/09/22  4.  Patient will report 72 on lumbar FOTO to demonstrate improved functional ability.  Baseline: 63 Goal status: Met 83 07/08/22   5.  Patient will be able to sweep and mop without an increase in back pain Baseline: pain with both activities Goal status: Progressing Still has some pain 07/09/22   PLAN:  PT FREQUENCY: 1-2x/week  PT DURATION: 10 weeks  PLANNED INTERVENTIONS: Therapeutic exercises, Therapeutic activity, Neuromuscular re-education, Balance training, Gait training, Patient/Family education, Self Care, Joint mobilization, Dry Needling, Spinal manipulation, Spinal mobilization, Cryotherapy, Moist heat, Vasopneumatic device, Traction, Ionotophoresis 4mg /ml Dexamethasone, and Manual therapy.  PLAN FOR NEXT SESSION: core stabilization and proximal hip stability   Macky Lower  Jamarco Zaldivar, PTA,  07/14/2022, 10:16 AM

## 2022-07-15 NOTE — Therapy (Signed)
OUTPATIENT PHYSICAL THERAPY THORACOLUMBAR TREATMENT       Patient Name: Kelli Flores MRN: [id number] DOB:25-Mar-1950, 72 y.o., female Today's Date: 07/16/2022  END OF SESSION:  PT End of Session - 07/16/22 1018     Visit Number 12    Date for PT Re-Evaluation 07/23/22    PT Start Time 1016    PT Stop Time 1100    PT Time Calculation (min) 44 min    Activity Tolerance Patient tolerated treatment well    Behavior During Therapy [facility] for tasks assessed/performed                 Past Medical History:  Diagnosis Date   Hypertension    Past Surgical History:  Procedure Laterality Date   BREAST BIOPSY Right 05/20/2022   MM RT BREAST BX W LOC DEV 1ST LESION IMAGE BX SPEC STEREO GUIDE 05/20/2022 [facility]   IR ANGIO INTRA EXTRACRAN SEL COM CAROTID INNOMINATE UNI L MOD SED  11/17/2018   IR ANGIO VERTEBRAL SEL VERTEBRAL UNI L MOD SED  11/17/2018   IR GENERIC HISTORICAL  02/14/2016   IR ANGIO INTRA EXTRACRAN SEL COM CAROTID INNOMINATE BILAT MOD SED 02/14/2016 Julieanne Cotton, MD [facility]   IR GENERIC HISTORICAL  02/14/2016   IR ANGIO VERTEBRAL SEL VERTEBRAL BILAT MOD SED 02/14/2016 Julieanne Cotton, MD [facility]   IR RADIOLOGIST EVAL & MGMT  03/31/2017   There are no problems to display for this patient.   PCP: Baruch Goldmann  REFERRING PROVIDER: Baruch Goldmann  REFERRING DIAG:  M54.50 (ICD-10-CM) - Low back pain, unspecified      Rationale for Evaluation and Treatment: Rehabilitation  THERAPY DIAG:  Radiculopathy, lumbar region  Muscle weakness (generalized)  Chronic left-sided low back pain with left-sided sciatica  ONSET DATE: 05/01/22  SUBJECTIVE:                                                                                                                                                                                           SUBJECTIVE STATEMENT: "Feeling good today" Not much sleep  PERTINENT HISTORY:  No remarkable  history  PAIN:  Are you having pain? Yes: NPRS scale: 3/10 Pain location: L hip posterior  Pain description: dull, constant, ache Aggravating factors: stress,  Relieving factors: hot water bottle, movement helps  PRECAUTIONS: None  WEIGHT BEARING RESTRICTIONS: No  FALLS:  Has patient fallen in last 6 months? No  LIVING ENVIRONMENT: Lives with: lives alone Lives in: House/apartment Stairs: Yes: External: 6 steps; can reach both Has following equipment at home: None  OCCUPATION: Retired  PLOF: Independent  PATIENT GOALS: to alleviate the  pain, fix whatever is wrong    OBJECTIVE:   DIAGNOSTIC FINDINGS:  None for the back  PATIENT SURVEYS:  FOTO 63  SCREENING FOR RED FLAGS: Bowel or bladder incontinence: No Spinal tumors: No Cauda equina syndrome: No Compression fracture: No Abdominal aneurysm: No  COGNITION: Overall cognitive status: Within functional limits for tasks assessed     SENSATION: WFL  MUSCLE LENGTH: Hamstrings: good flexibility   POSTURE: No Significant postural limitations  PALPATION: TTP L1-L5 spinous and transverse processes, bilateral SIJ, pain with compression on sacrum  LUMBAR ROM:   AROM eval 06/17/22 07/09/22  Flexion WNL WFL WFL  Extension 50% with pain Limited 25% with pain Limited 25%  Right lateral flexion WFL but pain at end range [facility] w/ pain WFL  Left lateral flexion WFL but pain at end range [facility] w/ Pain WFL  Right rotation 75% Limited 50% WFL  Left rotation 75% with pain Liited 50% WFL   (Blank rows = not tested)  LOWER EXTREMITY ROM:  grossly WFL    LOWER EXTREMITY MMT:  grossly 5/5 LE's    LUMBAR SPECIAL TESTS:  Straight leg raise test: Negative, Slump test: Negative, FABER test: Positive, and Gaenslen's test: Positive  FUNCTIONAL TESTS:  5 times sit to stand: 10.97s   TODAY'S TREATMENT:                                                                                                                               DATE:  07/16/22 Walking outdoors big lap around back building  Elliptical L1 x83mins  AB roll ups with red ball 2x10 Deadbugs 2x10 alt  Birddogs 2x10 alt  STS with chest press yellow ball 2x10 blackTB flexion 2x10 blackTB ext 2x10 AR press 15# 2x10 Calf stretch 30s x2  07/14/22 One laps around widest section of back parking lot Resisted gait 30lb all directions x4 each Shoulder 5lb 2x15  Rows & Lats 25lb 2x10  blackTB ext 2x10 Leg press 30lb 2x15   07/09/22 NuStep L5 x41mins  CHECKED GOALS Shoulder ext 10# 2x10 Front and lateral step ups 4in box on airex. Rows and Lats 20# 3x10 blackTB ext 3x10 S2S OHP 4lb dumbbell  07/02/22 NuStep L5 x45mins  Cable rows 10# 2x10 Shoulder ext 10# 2x10 AR press 10# 2x10  Step ups with airex on top 6" x10 each side  Calf stretch 30s Rows and Lats 25# 2x10 blackTB ext 2x10 BlackTB crunches 2x10  Leg press 20# 3x10    06/23/22 NuStep L5 x 7 min Seated rows & Lats 20lb 2x10 HS curls 20lb 2x10 Leg Ext 5lb 2x10 LE on Pball bridges, oblq, K2C Passive HS, Piriformis, Single and double K2C, lower trunk rotations  4/225/23 Twp laps around widest section of back parking lot Seated rows & Lats 20lb 2x10  Spine bridges  LE on Pball bridges, oblq, K2C Passive HS, Piriformis, Single and double K2C, lower trunk rotations  06/16/22 NuStep L5 x 6 min S2S  OHP w/ yellow ball 2x10 Seated rows & Lats 20lb 2x10 Seated lumbar Ext black band 2x10 HS curls 20lb 2x10 Leg Ext 5lb 2x10 Passive HS, Piriformis, Single and double K2C, lower trunk rotations Bridges  LE on Pball bridges, oblq, K2C  [facility] Adult PT Treatment:                                                DATE: 06/05/22 Therapeutic Exercise: Warm up walk down the hill to the bottom of the parking lot and back up at a brisk pace Bridge with green loop x10 Sidelying clams with green loop 2x10 B Mini Bridge + march x3 B slowly with tactile feedback to left knee/hip to increase stability STS  from lowest table setting with green loop above knees, focusing on eccentric control x10 then holding squat and pulsing x10 Forward fold at counter: extending one knee and flexing the other 2 Rolldowns/rollups in standing  06/02/22 NuStep L5 x40mins Walking outdoors big loop around [employer] with band 2x10 Clamshells green 2x10  AB ball roll ups 2x10 Leg ext 10# 2x10 HS curls 20# 2x10 Leg press 20# 2x10   05/28/22 NuStep L4 x70mins Hip add ball squeeze 2x10 Hip abd green 2x10 S2S 2x10 Shoulder Ext & Rows green 2x10 Feet on pball x10 rotations and knees to chest  Passive HS, Piriformis, Single and double K2C     05/14/22-EVAL     PATIENT EDUCATION:  Education details: POC and HEP Person educated: Patient Education method: Explanation Education comprehension: verbalized understanding  HOME EXERCISE PROGRAM: Access Code: [email or web address] URL: [email or web address] Date: 05/14/2022 Prepared by: Cassie Freer  Exercises - Supine Lower Trunk Rotation  - 1 x daily - 7 x weekly - 2 sets - 10 reps - Supine Bridge  - 1 x daily - 7 x weekly - 2 sets - 10 reps - Supine Double Knee to Chest Modified  - 1 x daily - 7 x weekly - 30 hold - Clamshell with Resistance  - 1 x daily - 7 x weekly - 2 sets - 10 reps  ASSESSMENT:  CLINICAL IMPRESSION: Pt is progressing well. Focused on back and core strengthening interventions. Does well with all exercises, no c/o of pain throughout session.    OBJECTIVE IMPAIRMENTS: pain.   REHAB POTENTIAL: Good  CLINICAL DECISION MAKING: Stable/uncomplicated  EVALUATION COMPLEXITY: Low   GOALS: Goals reviewed with patient? Yes  SHORT TERM GOALS: Target date: 06/18/22  Patient will be independent with initial HEP.  Baseline: given 05/14/22 Goal status: MET  2.  Patient will report centralization of radicular symptoms.  Baseline: radiates into LLE Goal status: MET   LONG TERM GOALS: Target date: 07/23/22  Patient will be  independent with advanced/ongoing HEP to improve outcomes and carryover.  Goal status: Progressing 07/09/22  2.  Patient will report 75% improvement in low back pain to improve QOL.  Baseline: 8/10 at worst Goal status: Progressing   3.  Patient will demonstrate full pain free lumbar ROM to perform ADLs.   Baseline: see table above Goal status: Progressing 07/09/22  4.  Patient will report 72 on lumbar FOTO to demonstrate improved functional ability.  Baseline: 63 Goal status: Met 83 07/08/22   5.  Patient will be able to sweep and mop without an increase in back pain Baseline: pain with both activities Goal status: Progressing Still  has some pain 07/09/22   PLAN:  PT FREQUENCY: 1-2x/week  PT DURATION: 10 weeks  PLANNED INTERVENTIONS: Therapeutic exercises, Therapeutic activity, Neuromuscular re-education, Balance training, Gait training, Patient/Family education, Self Care, Joint mobilization, Dry Needling, Spinal manipulation, Spinal mobilization, Cryotherapy, Moist heat, Vasopneumatic device, Traction, Ionotophoresis 4mg /ml Dexamethasone, and Manual therapy.  PLAN FOR NEXT SESSION: core stabilization and proximal hip stability   Cassie Freer, PT,  07/16/2022, 10:57 AM

## 2022-07-16 ENCOUNTER — Ambulatory Visit: Payer: Medicare PPO

## 2022-07-16 DIAGNOSIS — M5416 Radiculopathy, lumbar region: Secondary | ICD-10-CM | POA: Diagnosis not present

## 2022-07-16 DIAGNOSIS — M6281 Muscle weakness (generalized): Secondary | ICD-10-CM

## 2022-07-16 DIAGNOSIS — G8929 Other chronic pain: Secondary | ICD-10-CM

## 2022-07-21 ENCOUNTER — Encounter: Payer: Self-pay | Admitting: Physical Therapy

## 2022-07-21 ENCOUNTER — Ambulatory Visit: Payer: Medicare PPO | Admitting: Physical Therapy

## 2022-07-21 DIAGNOSIS — M6281 Muscle weakness (generalized): Secondary | ICD-10-CM

## 2022-07-21 DIAGNOSIS — M5416 Radiculopathy, lumbar region: Secondary | ICD-10-CM | POA: Diagnosis not present

## 2022-07-21 NOTE — Therapy (Signed)
OUTPATIENT PHYSICAL THERAPY THORACOLUMBAR TREATMENT       Patient Name: Kelli Flores MRN: [id number] DOB:Nov 12, 1950, 72 y.o., female Today's Date: 07/21/2022  END OF SESSION:  PT End of Session - 07/21/22 1015     Visit Number 13    Date for PT Re-Evaluation 07/23/22    PT Start Time 1015    PT Stop Time 1100    PT Time Calculation (min) 45 min    Activity Tolerance Patient tolerated treatment well    Behavior During Therapy [name] for tasks assessed/performed                 Past Medical History:  Diagnosis Date   Hypertension    Past Surgical History:  Procedure Laterality Date   BREAST BIOPSY Right 05/20/2022   MM RT BREAST BX W LOC DEV 1ST LESION IMAGE BX SPEC STEREO GUIDE 05/20/2022 [facility]   IR ANGIO INTRA EXTRACRAN SEL COM CAROTID INNOMINATE UNI L MOD SED  11/17/2018   IR ANGIO VERTEBRAL SEL VERTEBRAL UNI L MOD SED  11/17/2018   IR GENERIC HISTORICAL  02/14/2016   IR ANGIO INTRA EXTRACRAN SEL COM CAROTID INNOMINATE BILAT MOD SED 02/14/2016 Julieanne Cotton, MD [facility]   IR GENERIC HISTORICAL  02/14/2016   IR ANGIO VERTEBRAL SEL VERTEBRAL BILAT MOD SED 02/14/2016 Julieanne Cotton, MD [facility]   IR RADIOLOGIST EVAL & MGMT  03/31/2017   There are no problems to display for this patient.   PCP: Baruch Goldmann  REFERRING PROVIDER: Baruch Goldmann  REFERRING DIAG:  M54.50 (ICD-10-CM) - Low back pain, unspecified      Rationale for Evaluation and Treatment: Rehabilitation  THERAPY DIAG:  Radiculopathy, lumbar region  Muscle weakness (generalized)  ONSET DATE: 05/01/22  SUBJECTIVE:                                                                                                                                                                                           SUBJECTIVE STATEMENT: Feeling good  PERTINENT HISTORY:  No remarkable history  PAIN:  Are you having pain? Yes: NPRS scale: 0/10 Pain location: L hip posterior   Pain description: dull, constant, ache Aggravating factors: stress,  Relieving factors: hot water bottle, movement helps  PRECAUTIONS: None  WEIGHT BEARING RESTRICTIONS: No  FALLS:  Has patient fallen in last 6 months? No  LIVING ENVIRONMENT: Lives with: lives alone Lives in: House/apartment Stairs: Yes: External: 6 steps; can reach both Has following equipment at home: None  OCCUPATION: Retired  PLOF: Independent  PATIENT GOALS: to alleviate the pain, fix whatever is wrong    OBJECTIVE:   DIAGNOSTIC FINDINGS:  None for the back  PATIENT SURVEYS:  FOTO 63  SCREENING FOR RED FLAGS: Bowel or bladder incontinence: No Spinal tumors: No Cauda equina syndrome: No Compression fracture: No Abdominal aneurysm: No  COGNITION: Overall cognitive status: Within functional limits for tasks assessed     SENSATION: WFL  MUSCLE LENGTH: Hamstrings: good flexibility   POSTURE: No Significant postural limitations  PALPATION: TTP L1-L5 spinous and transverse processes, bilateral SIJ, pain with compression on sacrum  LUMBAR ROM:   AROM eval 06/17/22 07/09/22  Flexion WNL WFL WFL  Extension 50% with pain Limited 25% with pain Limited 25%  Right lateral flexion WFL but pain at end range [facility] w/ pain WFL  Left lateral flexion WFL but pain at end range [facility] w/ Pain WFL  Right rotation 75% Limited 50% WFL  Left rotation 75% with pain Liited 50% WFL   (Blank rows = not tested)  LOWER EXTREMITY ROM:  grossly WFL    LOWER EXTREMITY MMT:  grossly 5/5 LE's    LUMBAR SPECIAL TESTS:  Straight leg raise test: Negative, Slump test: Negative, FABER test: Positive, and Gaenslen's test: Positive  FUNCTIONAL TESTS:  5 times sit to stand: 10.97s   TODAY'S TREATMENT:                                                                                                                              DATE:  NuStep L5 x 7 min STS OHP press yellow ball 2x10 Rows & Lats 25lb 2x12 STM to  lumbar spine, thoracic spine, and UT  07/16/22 Walking outdoors big lap around back building  Elliptical L1 x85mins  AB roll ups with red ball 2x10 Deadbugs 2x10 alt  Birddogs 2x10 alt  STS with chest press yellow ball 2x10 blackTB flexion 2x10 blackTB ext 2x10 AR press 15# 2x10 Calf stretch 30s x2  07/14/22 One laps around widest section of back parking lot Resisted gait 30lb all directions x4 each Shoulder 5lb 2x15  Rows & Lats 25lb 2x10  blackTB ext 2x10 Leg press 30lb 2x15   07/09/22 NuStep L5 x2mins  CHECKED GOALS Shoulder ext 10# 2x10 Front and lateral step ups 4in box on airex. Rows and Lats 20# 3x10 blackTB ext 3x10 S2S OHP 4lb dumbbell  07/02/22 NuStep L5 x74mins  Cable rows 10# 2x10 Shoulder ext 10# 2x10 AR press 10# 2x10  Step ups with airex on top 6" x10 each side  Calf stretch 30s Rows and Lats 25# 2x10 blackTB ext 2x10 BlackTB crunches 2x10  Leg press 20# 3x10    06/23/22 NuStep L5 x 7 min Seated rows & Lats 20lb 2x10 HS curls 20lb 2x10 Leg Ext 5lb 2x10 LE on Pball bridges, oblq, K2C Passive HS, Piriformis, Single and double K2C, lower trunk rotations  4/225/23 Twp laps around widest section of back parking lot Seated rows & Lats 20lb 2x10  Spine bridges  LE on Pball bridges, oblq, K2C Passive HS, Piriformis, Single and double K2C,  lower trunk rotations  06/16/22 NuStep L5 x 6 min S2S OHP w/ yellow ball 2x10 Seated rows & Lats 20lb 2x10 Seated lumbar Ext black band 2x10 HS curls 20lb 2x10 Leg Ext 5lb 2x10 Passive HS, Piriformis, Single and double K2C, lower trunk rotations Bridges  LE on Pball bridges, oblq, K2C  [facility] Adult PT Treatment:                                                DATE: 06/05/22 Therapeutic Exercise: Warm up walk down the hill to the bottom of the parking lot and back up at a brisk pace Bridge with green loop x10 Sidelying clams with green loop 2x10 B Mini Bridge + march x3 B slowly with tactile feedback to left  knee/hip to increase stability STS from lowest table setting with green loop above knees, focusing on eccentric control x10 then holding squat and pulsing x10 Forward fold at counter: extending one knee and flexing the other 2 Rolldowns/rollups in standing  06/02/22 NuStep L5 x40mins Walking outdoors big loop around [employer] with band 2x10 Clamshells green 2x10  AB ball roll ups 2x10 Leg ext 10# 2x10 HS curls 20# 2x10 Leg press 20# 2x10   05/28/22 NuStep L4 x8mins Hip add ball squeeze 2x10 Hip abd green 2x10 S2S 2x10 Shoulder Ext & Rows green 2x10 Feet on pball x10 rotations and knees to chest  Passive HS, Piriformis, Single and double K2C     05/14/22-EVAL     PATIENT EDUCATION:  Education details: POC and HEP Person educated: Patient Education method: Explanation Education comprehension: verbalized understanding  HOME EXERCISE PROGRAM: Access Code: [email or web address] URL: [email or web address] Date: 05/14/2022 Prepared by: Cassie Freer  Exercises - Supine Lower Trunk Rotation  - 1 x daily - 7 x weekly - 2 sets - 10 reps - Supine Bridge  - 1 x daily - 7 x weekly - 2 sets - 10 reps - Supine Double Knee to Chest Modified  - 1 x daily - 7 x weekly - 30 hold - Clamshell with Resistance  - 1 x daily - 7 x weekly - 2 sets - 10 reps  ASSESSMENT:  CLINICAL IMPRESSION: Pt is progressing well. Focused on back and core strengthening interventions. Does well with all exercises, no c/o of pain throughout session. Added STM to address some dense tissue u para spinales and Ut   OBJECTIVE IMPAIRMENTS: pain.   REHAB POTENTIAL: Good  CLINICAL DECISION MAKING: Stable/uncomplicated  EVALUATION COMPLEXITY: Low   GOALS: Goals reviewed with patient? Yes  SHORT TERM GOALS: Target date: 06/18/22  Patient will be independent with initial HEP.  Baseline: given 05/14/22 Goal status: MET  2.  Patient will report centralization of radicular symptoms.  Baseline: radiates  into LLE Goal status: MET   LONG TERM GOALS: Target date: 07/23/22  Patient will be independent with advanced/ongoing HEP to improve outcomes and carryover.  Goal status: Progressing 07/09/22  2.  Patient will report 75% improvement in low back pain to improve QOL.  Baseline: 8/10 at worst Goal status: Progressing   3.  Patient will demonstrate full pain free lumbar ROM to perform ADLs.   Baseline: see table above Goal status: Progressing 07/09/22  4.  Patient will report 72 on lumbar FOTO to demonstrate improved functional ability.  Baseline: 63 Goal status: Met 83 07/08/22   5.  Patient  will be able to sweep and mop without an increase in back pain Baseline: pain with both activities Goal status: Progressing Still has some pain 07/09/22   PLAN:  PT FREQUENCY: 1-2x/week  PT DURATION: 10 weeks  PLANNED INTERVENTIONS: Therapeutic exercises, Therapeutic activity, Neuromuscular re-education, Balance training, Gait training, Patient/Family education, Self Care, Joint mobilization, Dry Needling, Spinal manipulation, Spinal mobilization, Cryotherapy, Moist heat, Vasopneumatic device, Traction, Ionotophoresis 4mg /ml Dexamethasone, and Manual therapy.  PLAN FOR NEXT SESSION: core stabilization and proximal hip stability   Grayce Sessions, PTA,  07/21/2022, 10:16 AM

## 2022-07-22 NOTE — Therapy (Signed)
OUTPATIENT PHYSICAL THERAPY THORACOLUMBAR TREATMENT       Patient Name: Kelli Flores MRN: [id number] DOB:1950/03/30, 72 y.o., female Today's Date: 07/21/2022  END OF SESSION:  PT End of Session - 07/21/22 1015     Visit Number 13    Date for PT Re-Evaluation 07/23/22    PT Start Time 1015    PT Stop Time 1100    PT Time Calculation (min) 45 min    Activity Tolerance Patient tolerated treatment well    Behavior During Therapy [facility] for tasks assessed/performed                 Past Medical History:  Diagnosis Date   Hypertension    Past Surgical History:  Procedure Laterality Date   BREAST BIOPSY Right 05/20/2022   MM RT BREAST BX W LOC DEV 1ST LESION IMAGE BX SPEC STEREO GUIDE 05/20/2022 [facility]   IR ANGIO INTRA EXTRACRAN SEL COM CAROTID INNOMINATE UNI L MOD SED  11/17/2018   IR ANGIO VERTEBRAL SEL VERTEBRAL UNI L MOD SED  11/17/2018   IR GENERIC HISTORICAL  02/14/2016   IR ANGIO INTRA EXTRACRAN SEL COM CAROTID INNOMINATE BILAT MOD SED 02/14/2016 Julieanne Cotton, MD [facility]   IR GENERIC HISTORICAL  02/14/2016   IR ANGIO VERTEBRAL SEL VERTEBRAL BILAT MOD SED 02/14/2016 Julieanne Cotton, MD [facility]   IR RADIOLOGIST EVAL & MGMT  03/31/2017   There are no problems to display for this patient.   PCP: Baruch Goldmann  REFERRING PROVIDER: Baruch Goldmann  REFERRING DIAG:  M54.50 (ICD-10-CM) - Low back pain, unspecified      Rationale for Evaluation and Treatment: Rehabilitation  THERAPY DIAG:  Radiculopathy, lumbar region  Muscle weakness (generalized)  ONSET DATE: 05/01/22  SUBJECTIVE:                                                                                                                                                                                           SUBJECTIVE STATEMENT: Feeling good  PERTINENT HISTORY:  No remarkable history  PAIN:  Are you having pain? Yes: NPRS scale: 0/10 Pain location: L hip posterior   Pain description: dull, constant, ache Aggravating factors: stress,  Relieving factors: hot water bottle, movement helps  PRECAUTIONS: None  WEIGHT BEARING RESTRICTIONS: No  FALLS:  Has patient fallen in last 6 months? No  LIVING ENVIRONMENT: Lives with: lives alone Lives in: House/apartment Stairs: Yes: External: 6 steps; can reach both Has following equipment at home: None  OCCUPATION: Retired  PLOF: Independent  PATIENT GOALS: to alleviate the pain, fix whatever is wrong    OBJECTIVE:   DIAGNOSTIC FINDINGS:  None for the back  PATIENT SURVEYS:  FOTO 63  SCREENING FOR RED FLAGS: Bowel or bladder incontinence: No Spinal tumors: No Cauda equina syndrome: No Compression fracture: No Abdominal aneurysm: No  COGNITION: Overall cognitive status: Within functional limits for tasks assessed     SENSATION: WFL  MUSCLE LENGTH: Hamstrings: good flexibility   POSTURE: No Significant postural limitations  PALPATION: TTP L1-L5 spinous and transverse processes, bilateral SIJ, pain with compression on sacrum  LUMBAR ROM:   AROM eval 06/17/22 07/09/22 07/23/22  Flexion WNL WFL WFL WFL  Extension 50% with pain Limited 25% with pain Limited 25% WFL  Right lateral flexion WFL but pain at end range [facility] w/ pain [facility] WFL  Left lateral flexion WFL but pain at end range [facility] w/ Pain [facility] WFL  Right rotation 75% Limited 50% WFL WFL  Left rotation 75% with pain Liited 50% WFL WFL   (Blank rows = not tested)  LOWER EXTREMITY ROM:  grossly WFL    LOWER EXTREMITY MMT:  grossly 5/5 LE's    LUMBAR SPECIAL TESTS:  Straight leg raise test: Negative, Slump test: Negative, FABER test: Positive, and Gaenslen's test: Positive  FUNCTIONAL TESTS:  5 times sit to stand: 10.97s   TODAY'S TREATMENT:                                                                                                                              DATE:  07/23/22 Walking outdoors 2 big laps  NuStep L5  x38mins Shoulder ext 10# 2x10  Cable rows 10# 2x10  Rows and lats 25# 2x10   07/21/22 NuStep L5 x 7 min STS OHP press yellow ball 2x10 Rows & Lats 25lb 2x12 STM to lumbar spine, thoracic spine, and UT  07/16/22 Walking outdoors big lap around back building  Elliptical L1 x33mins  AB roll ups with red ball 2x10 Deadbugs 2x10 alt  Birddogs 2x10 alt  STS with chest press yellow ball 2x10 blackTB flexion 2x10 blackTB ext 2x10 AR press 15# 2x10 Calf stretch 30s x2  07/14/22 One laps around widest section of back parking lot Resisted gait 30lb all directions x4 each Shoulder 5lb 2x15  Rows & Lats 25lb 2x10  blackTB ext 2x10 Leg press 30lb 2x15   07/09/22 NuStep L5 x30mins  CHECKED GOALS Shoulder ext 10# 2x10 Front and lateral step ups 4in box on airex. Rows and Lats 20# 3x10 blackTB ext 3x10 S2S OHP 4lb dumbbell  07/02/22 NuStep L5 x58mins  Cable rows 10# 2x10 Shoulder ext 10# 2x10 AR press 10# 2x10  Step ups with airex on top 6" x10 each side  Calf stretch 30s Rows and Lats 25# 2x10 blackTB ext 2x10 BlackTB crunches 2x10  Leg press 20# 3x10    06/23/22 NuStep L5 x 7 min Seated rows & Lats 20lb 2x10 HS curls 20lb 2x10 Leg Ext 5lb 2x10 LE on Pball bridges, oblq, K2C Passive HS, Piriformis, Single and double K2C, lower trunk  rotations  4/225/23 Twp laps around widest section of back parking lot Seated rows & Lats 20lb 2x10  Spine bridges  LE on Pball bridges, oblq, K2C Passive HS, Piriformis, Single and double K2C, lower trunk rotations  06/16/22 NuStep L5 x 6 min S2S OHP w/ yellow ball 2x10 Seated rows & Lats 20lb 2x10 Seated lumbar Ext black band 2x10 HS curls 20lb 2x10 Leg Ext 5lb 2x10 Passive HS, Piriformis, Single and double K2C, lower trunk rotations Bridges  LE on Pball bridges, oblq, K2C  [facility] Adult PT Treatment:                                                DATE: 06/05/22 Therapeutic Exercise: Warm up walk down the hill to the bottom of the  parking lot and back up at a brisk pace Bridge with green loop x10 Sidelying clams with green loop 2x10 B Mini Bridge + march x3 B slowly with tactile feedback to left knee/hip to increase stability STS from lowest table setting with green loop above knees, focusing on eccentric control x10 then holding squat and pulsing x10 Forward fold at counter: extending one knee and flexing the other 2 Rolldowns/rollups in standing  06/02/22 NuStep L5 x7mins Walking outdoors big loop around [employer] with band 2x10 Clamshells green 2x10  AB ball roll ups 2x10 Leg ext 10# 2x10 HS curls 20# 2x10 Leg press 20# 2x10   05/28/22 NuStep L4 x82mins Hip add ball squeeze 2x10 Hip abd green 2x10 S2S 2x10 Shoulder Ext & Rows green 2x10 Feet on pball x10 rotations and knees to chest  Passive HS, Piriformis, Single and double K2C     05/14/22-EVAL     PATIENT EDUCATION:  Education details: POC and HEP Person educated: Patient Education method: Explanation Education comprehension: verbalized understanding  HOME EXERCISE PROGRAM: Access Code: [email or web address] URL: [email or web address] Date: 05/14/2022 Prepared by: Cassie Freer  Exercises - Supine Lower Trunk Rotation  - 1 x daily - 7 x weekly - 2 sets - 10 reps - Supine Bridge  - 1 x daily - 7 x weekly - 2 sets - 10 reps - Supine Double Knee to Chest Modified  - 1 x daily - 7 x weekly - 30 hold - Clamshell with Resistance  - 1 x daily - 7 x weekly - 2 sets - 10 reps  ASSESSMENT:  CLINICAL IMPRESSION: Patient states she is does not want anymore commitments as of right now, and would like to be done with PT as she has reached the end of her POC. She reports her back is 85% better since she started and is pleased with her current functional level. She has home exercises that she will continue. Has met most of her goals. Advised to call if she ever has questions.    OBJECTIVE IMPAIRMENTS: pain.   REHAB POTENTIAL: Good  CLINICAL  DECISION MAKING: Stable/uncomplicated  EVALUATION COMPLEXITY: Low   GOALS: Goals reviewed with patient? Yes  SHORT TERM GOALS: Target date: 06/18/22  Patient will be independent with initial HEP.  Baseline: given 05/14/22 Goal status: MET  2.  Patient will report centralization of radicular symptoms.  Baseline: radiates into LLE Goal status: MET   LONG TERM GOALS: Target date: 07/23/22  Patient will be independent with advanced/ongoing HEP to improve outcomes and carryover.  Goal status: Progressing 07/09/22, MET 07/23/22  2.  Patient will report 75% improvement in low back pain to improve QOL.  Baseline: 8/10 at worst Goal status: Progressing, 85% MET 07/23/22  3.  Patient will demonstrate full pain free lumbar ROM to perform ADLs.   Baseline: see table above Goal status: Progressing 07/09/22, MET 07/23/22  4.  Patient will report 72 on lumbar FOTO to demonstrate improved functional ability.  Baseline: 63 Goal status: Met 83 07/08/22   5.  Patient will be able to sweep and mop without an increase in back pain Baseline: pain with both activities Goal status: Progressing Still has some pain 07/09/22, Not met 07/23/22   PLAN:  PT FREQUENCY: 1-2x/week  PT DURATION: 10 weeks  PLANNED INTERVENTIONS: Therapeutic exercises, Therapeutic activity, Neuromuscular re-education, Balance training, Gait training, Patient/Family education, Self Care, Joint mobilization, Dry Needling, Spinal manipulation, Spinal mobilization, Cryotherapy, Moist heat, Vasopneumatic device, Traction, Ionotophoresis 4mg /ml Dexamethasone, and Manual therapy.  PLAN FOR NEXT SESSION: core stabilization and proximal hip stability  PHYSICAL THERAPY DISCHARGE SUMMARY  Visits from Start of Care: 14  Patient agrees to discharge. Patient goals were partially met. Patient is being discharged due to being pleased with the current functional level.    Grayce Sessions, PTA,  07/21/2022, 10:16 AM

## 2022-07-23 ENCOUNTER — Ambulatory Visit: Payer: Medicare PPO

## 2022-07-23 DIAGNOSIS — M5416 Radiculopathy, lumbar region: Secondary | ICD-10-CM

## 2022-07-23 DIAGNOSIS — G8929 Other chronic pain: Secondary | ICD-10-CM

## 2022-07-23 DIAGNOSIS — M6281 Muscle weakness (generalized): Secondary | ICD-10-CM

## 2023-03-10 ENCOUNTER — Other Ambulatory Visit: Payer: Self-pay | Admitting: Family

## 2023-03-10 DIAGNOSIS — Z1231 Encounter for screening mammogram for malignant neoplasm of breast: Secondary | ICD-10-CM

## 2023-04-22 ENCOUNTER — Ambulatory Visit
Admission: RE | Admit: 2023-04-22 | Discharge: 2023-04-22 | Disposition: A | Discharge status: Home / Self Care | Payer: Medicare PPO | Source: Ambulatory Visit | Attending: Family | Admitting: Family

## 2023-04-22 DIAGNOSIS — Z1231 Encounter for screening mammogram for malignant neoplasm of breast: Secondary | ICD-10-CM

## 2023-04-28 ENCOUNTER — Other Ambulatory Visit (HOSPITAL_COMMUNITY): Payer: Self-pay | Admitting: Interventional Radiology

## 2023-04-28 DIAGNOSIS — I770 Arteriovenous fistula, acquired: Secondary | ICD-10-CM

## 2023-05-13 ENCOUNTER — Ambulatory Visit (HOSPITAL_COMMUNITY)
Admission: RE | Admit: 2023-05-13 | Discharge: 2023-05-13 | Disposition: A | Discharge status: Home / Self Care | Source: Ambulatory Visit | Attending: Interventional Radiology | Admitting: Interventional Radiology

## 2023-05-13 DIAGNOSIS — I770 Arteriovenous fistula, acquired: Secondary | ICD-10-CM | POA: Diagnosis present

## 2023-05-13 MED ORDER — IOHEXOL 350 MG/ML SOLN
75.0000 mL | Freq: Once | INTRAVENOUS | Status: AC | PRN
  Administered 2023-05-13: 75 mL via INTRAVENOUS

## 2023-05-13 MED ORDER — SODIUM CHLORIDE (PF) 0.9 % IJ SOLN
INTRAMUSCULAR | Status: AC
  Filled 2023-05-13: qty 50

## 2023-05-27 ENCOUNTER — Telehealth (HOSPITAL_COMMUNITY): Payer: Self-pay | Admitting: Interventional Radiology

## 2023-05-27 NOTE — Telephone Encounter (Signed)
Called regarding recent imaging, no answer, left vm. AB

## 2023-05-27 NOTE — Telephone Encounter (Signed)
Pt agreed to f/u in 2 years with a cta head/neck. AB

## 2023-07-22 ENCOUNTER — Other Ambulatory Visit (HOSPITAL_BASED_OUTPATIENT_CLINIC_OR_DEPARTMENT_OTHER): Payer: Self-pay | Admitting: Family

## 2023-07-22 DIAGNOSIS — Z0000 Encounter for general adult medical examination without abnormal findings: Secondary | ICD-10-CM

## 2023-08-04 ENCOUNTER — Ambulatory Visit (HOSPITAL_BASED_OUTPATIENT_CLINIC_OR_DEPARTMENT_OTHER)
Admission: RE | Admit: 2023-08-04 | Discharge: 2023-08-04 | Disposition: A | Discharge status: Home / Self Care | Source: Ambulatory Visit | Attending: Family | Admitting: Family

## 2023-08-04 DIAGNOSIS — Z0000 Encounter for general adult medical examination without abnormal findings: Secondary | ICD-10-CM | POA: Insufficient documentation

## 2023-08-04 DIAGNOSIS — Z13820 Encounter for screening for osteoporosis: Secondary | ICD-10-CM | POA: Diagnosis not present

## 2023-08-04 DIAGNOSIS — M8589 Other specified disorders of bone density and structure, multiple sites: Secondary | ICD-10-CM | POA: Insufficient documentation

## 2023-08-04 DIAGNOSIS — Z780 Asymptomatic menopausal state: Secondary | ICD-10-CM | POA: Insufficient documentation

## 2023-08-09 ENCOUNTER — Encounter: Payer: Self-pay | Admitting: Emergency Medicine

## 2023-08-09 ENCOUNTER — Ambulatory Visit: Admission: EM | Admit: 2023-08-09 | Discharge: 2023-08-09 | Disposition: A | Discharge status: Home / Self Care

## 2023-08-09 DIAGNOSIS — F419 Anxiety disorder, unspecified: Secondary | ICD-10-CM | POA: Insufficient documentation

## 2023-08-09 DIAGNOSIS — M79643 Pain in unspecified hand: Secondary | ICD-10-CM | POA: Insufficient documentation

## 2023-08-09 DIAGNOSIS — I770 Arteriovenous fistula, acquired: Secondary | ICD-10-CM | POA: Insufficient documentation

## 2023-08-09 DIAGNOSIS — H9319 Tinnitus, unspecified ear: Secondary | ICD-10-CM | POA: Insufficient documentation

## 2023-08-09 DIAGNOSIS — M5442 Lumbago with sciatica, left side: Secondary | ICD-10-CM

## 2023-08-09 DIAGNOSIS — E663 Overweight: Secondary | ICD-10-CM | POA: Insufficient documentation

## 2023-08-09 DIAGNOSIS — M8580 Other specified disorders of bone density and structure, unspecified site: Secondary | ICD-10-CM | POA: Insufficient documentation

## 2023-08-09 DIAGNOSIS — Z9889 Other specified postprocedural states: Secondary | ICD-10-CM | POA: Insufficient documentation

## 2023-08-09 DIAGNOSIS — R1033 Periumbilical pain: Secondary | ICD-10-CM | POA: Insufficient documentation

## 2023-08-09 DIAGNOSIS — Z800 Family history of malignant neoplasm of digestive organs: Secondary | ICD-10-CM | POA: Insufficient documentation

## 2023-08-09 MED ORDER — PREDNISONE 20 MG PO TABS: 40.0000 mg | ORAL_TABLET | Freq: Every day | ORAL | 0 refills | Status: AC

## 2023-08-09 MED ORDER — TIZANIDINE HCL 4 MG PO TABS: 2.0000 mg | ORAL_TABLET | Freq: Four times a day (QID) | ORAL | 0 refills | Status: AC | PRN

## 2023-08-09 NOTE — ED Triage Notes (Signed)
 Pt reports L-sided back pain, hip pain, and leg pain x6 days. Believes it is her sciatica flaring up. Describes constant, sharp pain running down L leg. She can make it during the day with heating pads, but it has been impossible to sleep.

## 2023-08-09 NOTE — ED Provider Notes (Signed)
 [facility]    CSN: [id number] Arrival date & time: 08/09/23  1532      History   Chief Complaint Chief Complaint  Patient presents with   Back Pain   Leg Pain    HPI Kelli Flores is a 73 y.o. female.   Patient here today for evaluation of left-sided low back pain, hip pain and leg pain that started 6 days ago.  She reports she thinks it is her sciatica flaring up.  She reports that she has had sciatica in the past but not this bad.  He denies any known injury.  She has tried using heating pads without significant improvement.  The history is provided by the patient.  Back Pain Associated symptoms: leg pain   Associated symptoms: no abdominal pain and no fever   Leg Pain Associated symptoms: back pain   Associated symptoms: no fever     Past Medical History:  Diagnosis Date   Hypertension     Patient Active Problem List   Diagnosis Date Noted   A-V fistula (HCC) 08/09/2023   Abdominal pain, periumbilical 08/09/2023   Anxiety 08/09/2023   Family history of rectal cancer 08/09/2023   Hand pain 08/09/2023   Osteopenia 08/09/2023   Overweight (BMI 25.0-29.9) 08/09/2023   Status post coil embolization of cerebral aneurysm 08/09/2023   Tinnitus 08/09/2023   Hyperlipidemia 02/01/2020   Hypertensive disorder 02/01/2020   Irritable bowel syndrome 02/01/2020   Bilateral carpal tunnel syndrome 05/11/2018    Past Surgical History:  Procedure Laterality Date   BREAST BIOPSY Right 05/20/2022   MM RT BREAST BX W LOC DEV 1ST LESION IMAGE BX SPEC STEREO GUIDE 05/20/2022 [facility]   IR ANGIO INTRA EXTRACRAN SEL COM CAROTID INNOMINATE UNI L MOD SED  11/17/2018   IR ANGIO VERTEBRAL SEL VERTEBRAL UNI L MOD SED  11/17/2018   IR GENERIC HISTORICAL  02/14/2016   IR ANGIO INTRA EXTRACRAN SEL COM CAROTID INNOMINATE BILAT MOD SED 02/14/2016 Luellen Sages, MD [facility]   IR GENERIC HISTORICAL  02/14/2016   IR ANGIO VERTEBRAL SEL VERTEBRAL BILAT  MOD SED 02/14/2016 Luellen Sages, MD [facility]   IR RADIOLOGIST EVAL & MGMT  03/31/2017    OB History   No obstetric history on file.      Home Medications    Prior to Admission medications   Medication Sig Start Date End Date Taking? Authorizing Provider  Acetaminophen (TYLENOL) 325 MG CAPS 1 capsule as needed Orally every 6 hrs As needed   Yes [provider]  ascorbic acid (VITAMIN C) 250 MG CHEW 1 tablet Orally Once a day   Yes [provider]  aspirin EC 81 MG tablet Take 81 mg by mouth every evening.   Yes [provider]  atenolol (TENORMIN) 100 MG tablet Take 100 mg by mouth daily.   Yes [provider]  Calcium Carb-Cholecalciferol 1000-800 MG-UNIT TABS Take 1 tablet by mouth daily with supper. Only take 5 days out of the week   Yes [provider]  chlorthalidone (HYGROTON) 25 MG tablet Take 25 mg by mouth every morning.   Yes [provider]  DULoxetine (CYMBALTA) 20 MG capsule Take 20 mg by mouth daily. 07/29/23  Yes [provider]  Multiple Vitamins-Minerals (MULTIVITAMIN WITH MINERALS) tablet Take 1 tablet by mouth daily.   Yes [provider]  predniSONE  (DELTASONE ) 20 MG tablet Take 2 tablets (40 mg total) by mouth daily with breakfast for 5 days. 08/09/23 08/14/23  Yes Vernestine Gondola, PA-C  simvastatin (ZOCOR) 40 MG tablet Take 40 mg by mouth every evening.    Yes [provider]  tiZANidine (ZANAFLEX) 4 MG tablet Take 0.5 tablets (2 mg total) by mouth every 6 (six) hours as needed for muscle spasms. 08/09/23  Yes Vernestine Gondola, PA-C  amoxicillin (AMOXIL) 500 MG capsule     [provider]  atenolol-chlorthalidone (TENORETIC) 50-25 MG per tablet Take 0.5 tablets by mouth every morning.  Patient not taking: Reported on 08/09/2023    [provider]  cephALEXin (KEFLEX) 500 MG capsule     [provider]  Cholecalciferol (VITAMIN D) 125 MCG (5000 UT) CAPS Take  1,000 Units by mouth daily. 5 days out of the week Patient not taking: Reported on 08/09/2023    [provider]  Cholecalciferol 50 MCG (2000 UT) TABS 1 tablet Orally Once a day Patient not taking: Reported on 08/09/2023    [provider]  EPINEPHrine 0.3 mg/0.3 mL IJ SOAJ injection INJECT CONTENTS OF 1 PEN AS NEEDED FOR ALLERGIC REACTION    [provider]  fluticasone (FLONASE) 50 MCG/ACT nasal spray     [provider]  levocetirizine (XYZAL) 5 MG tablet Take 1 tablet by mouth every evening.    [provider]    Family History Family History  Problem Relation Age of Onset   High blood pressure Mother    Kidney failure Mother    Congestive Heart Failure Mother    Congestive Heart Failure Father    High blood pressure Father     Social History Social History   Tobacco Use   Smoking status: Never   Smokeless tobacco: Never  Vaping Use   Vaping status: Never Used  Substance Use Topics   Alcohol use: Yes    Comment: Occasional Drinks   Drug use: Never     Allergies   Shellfish-derived products, Shellfish allergy, Amoxicillin-pot clavulanate, and Codeine   Review of Systems Review of Systems  Constitutional:  Negative for chills and fever.  Eyes:  Negative for discharge and redness.  Respiratory:  Negative for shortness of breath.   Gastrointestinal:  Negative for abdominal pain, nausea and vomiting.  Musculoskeletal:  Positive for back pain.     Physical Exam Triage Vital Signs ED Triage Vitals [08/09/23 1542]  Encounter Vitals Group     BP 124/76     Girls Systolic BP Percentile      Girls Diastolic BP Percentile      Boys Systolic BP Percentile      Boys Diastolic BP Percentile      Pulse Rate 68     Resp 18     Temp 98 F (36.7 C)     Temp Source Oral     SpO2 98 %     Weight      Height      Head Circumference      Peak Flow      Pain Score 8     Pain Loc      Pain Education      Exclude from Growth  Chart    No data found.  Updated Vital Signs BP 124/76 (BP Location: Left Arm)   Pulse 68   Temp 98 F (36.7 C) (Oral)   Resp 18   SpO2 98%   Visual Acuity Right Eye Distance:   Left Eye Distance:   Bilateral Distance:    Right Eye Near:   Left Eye  Near:    Bilateral Near:     Physical Exam Vitals and nursing note reviewed.  Constitutional:      General: She is not in acute distress.    Appearance: Normal appearance. She is not ill-appearing.  HENT:     Head: Normocephalic and atraumatic.   Eyes:     Conjunctiva/sclera: Conjunctivae normal.    Cardiovascular:     Rate and Rhythm: Normal rate.  Pulmonary:     Effort: Pulmonary effort is normal. No respiratory distress.   Musculoskeletal:     Comments: No tenderness to palpation of midline thoracic or lumbar spine.  Mild tenderness noted to left lower back   Neurological:     Mental Status: She is alert.   Psychiatric:        Mood and Affect: Mood normal.        Behavior: Behavior normal.        Thought Content: Thought content normal.      UC Treatments / Results  Labs (all labs ordered are listed, but only abnormal results are displayed) Labs Reviewed - No data to display  EKG   Radiology No results found.  Procedures Procedures (including critical care time)  Medications Ordered in UC Medications - No data to display  Initial Impression / Assessment and Plan / UC Course  I have reviewed the triage vital signs and the nursing notes.  Pertinent labs & imaging results that were available during my care of the patient were reviewed by me and considered in my medical decision making (see chart for details).    Steroid burst and muscle relaxer prescribed for suspected sciatica.  Discussed that this medicine may cause drowsiness.  Recommended further evaluation if no gradual improvement or with any further concerns.  Final Clinical Impressions(s) / UC Diagnoses   Final diagnoses:  Acute  left-sided low back pain with left-sided sciatica   Discharge Instructions   None    ED Prescriptions     Medication Sig Dispense Auth. Provider   predniSONE  (DELTASONE ) 20 MG tablet Take 2 tablets (40 mg total) by mouth daily with breakfast for 5 days. 10 tablet Jami Mcclintock F, PA-C   tiZANidine (ZANAFLEX) 4 MG tablet Take 0.5 tablets (2 mg total) by mouth every 6 (six) hours as needed for muscle spasms. 30 tablet Vernestine Gondola, PA-C      PDMP not reviewed this encounter.   Vernestine Gondola, PA-C 08/09/23 1953

## 2023-08-20 ENCOUNTER — Other Ambulatory Visit (HOSPITAL_BASED_OUTPATIENT_CLINIC_OR_DEPARTMENT_OTHER): Payer: Self-pay | Admitting: Family

## 2023-08-20 DIAGNOSIS — Z0000 Encounter for general adult medical examination without abnormal findings: Secondary | ICD-10-CM

## 2023-08-21 ENCOUNTER — Encounter (HOSPITAL_COMMUNITY): Payer: Self-pay | Admitting: Interventional Radiology

## 2023-10-22 IMAGING — MG MM DIGITAL SCREENING BILAT W/ TOMO AND CAD
8 series · 8 of 24 positions shown · non-contrast
Comparison: Previous exam(s).

CLINICAL DATA: Screening.

EXAM:
DIGITAL SCREENING BILATERAL MAMMOGRAM WITH TOMOSYNTHESIS AND CAD
TECHNIQUE: Bilateral screening digital craniocaudal and mediolateral oblique
mammograms were obtained. Bilateral screening digital breast
tomosynthesis was performed. The images were evaluated with
computer-aided detection.

[L MLO synth-2D]
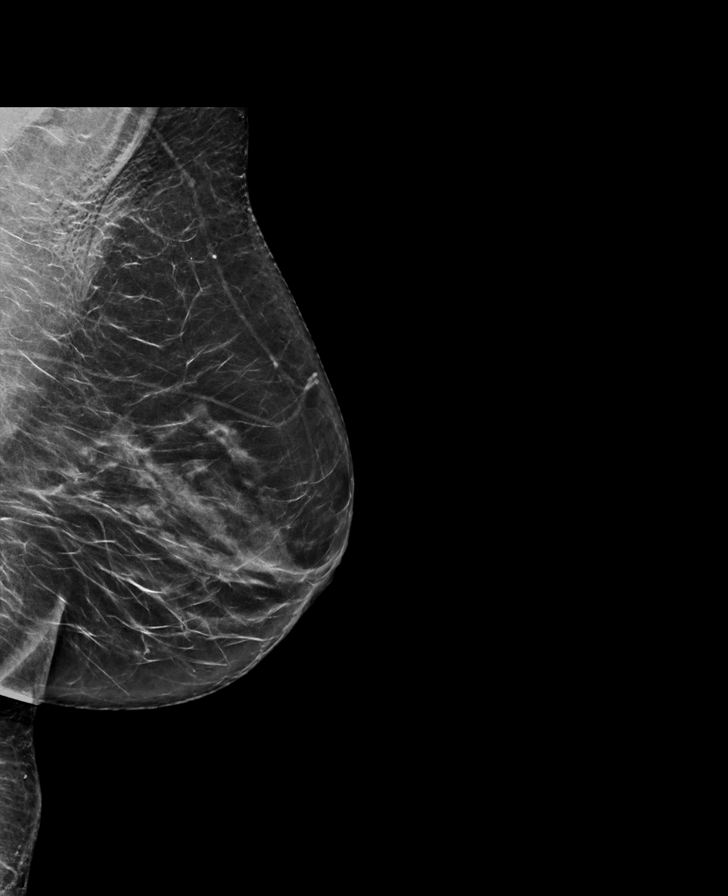

[R CC synth-2D]
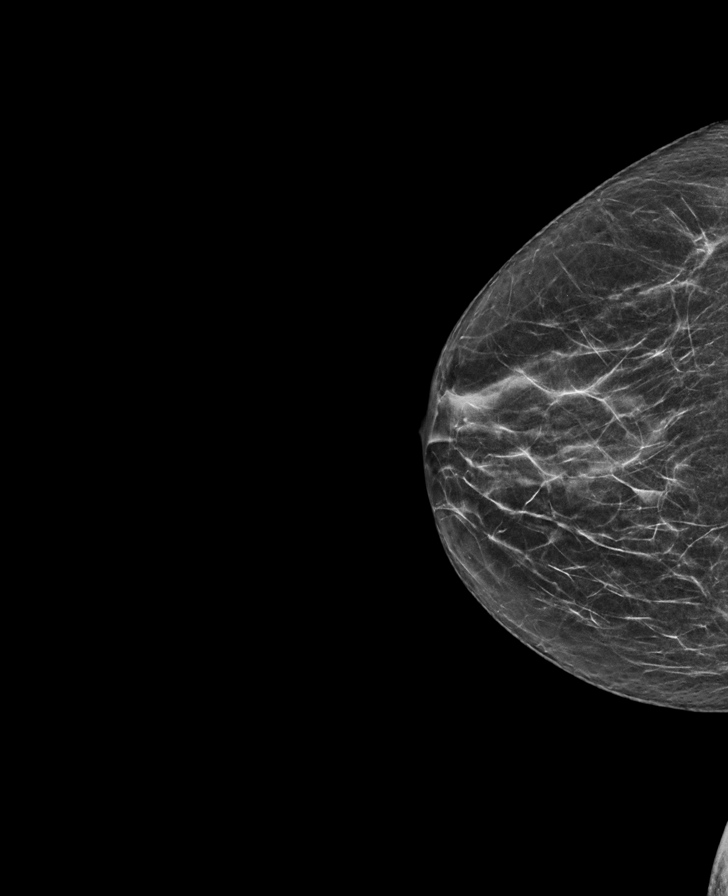

[L CC synth-2D]
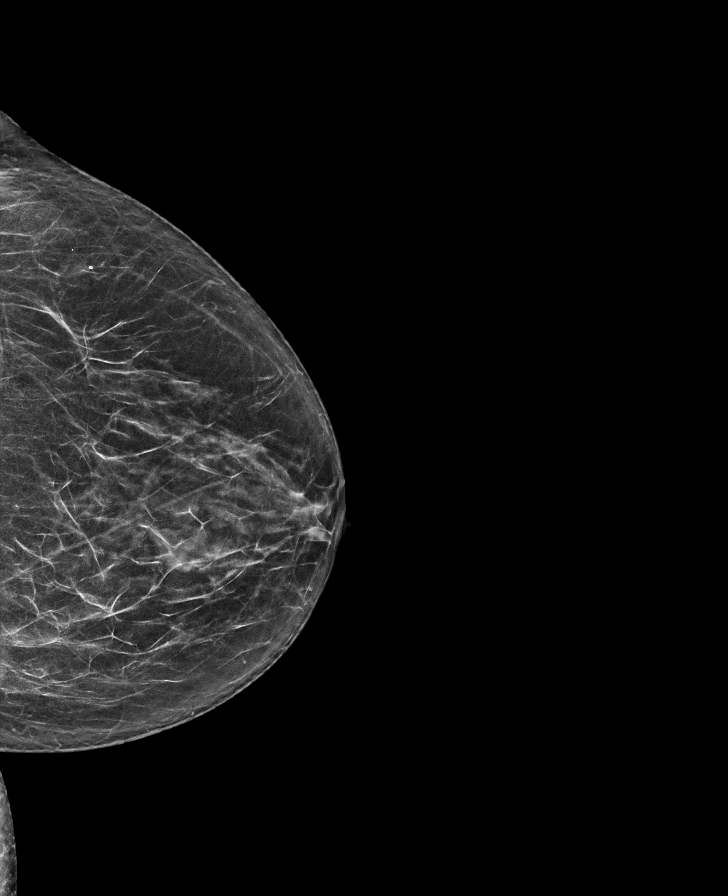

[R MLO synth-2D]
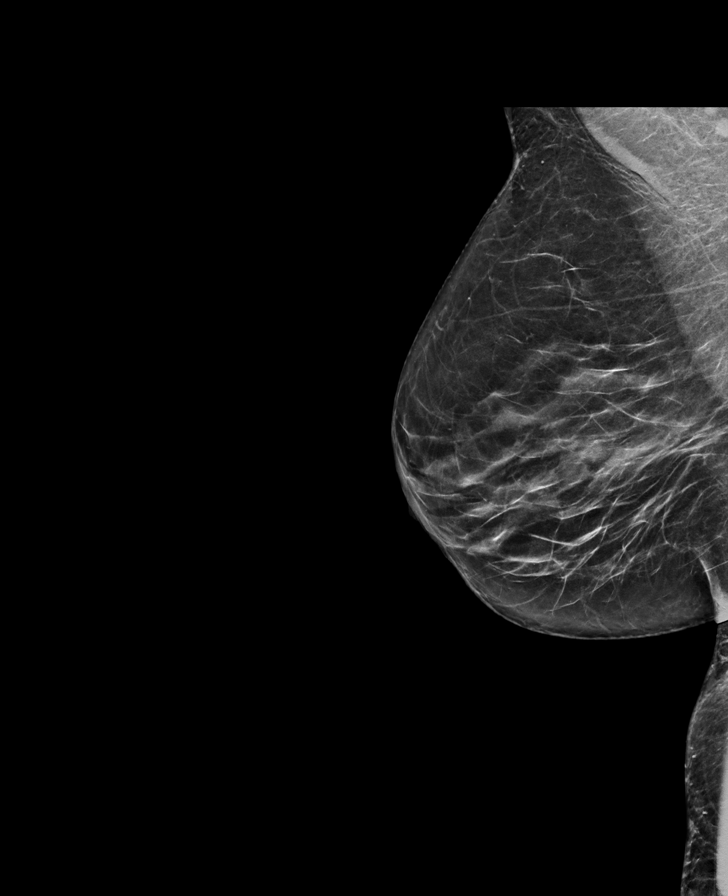

[R MLO tomo · tomo slice 31/62.0]
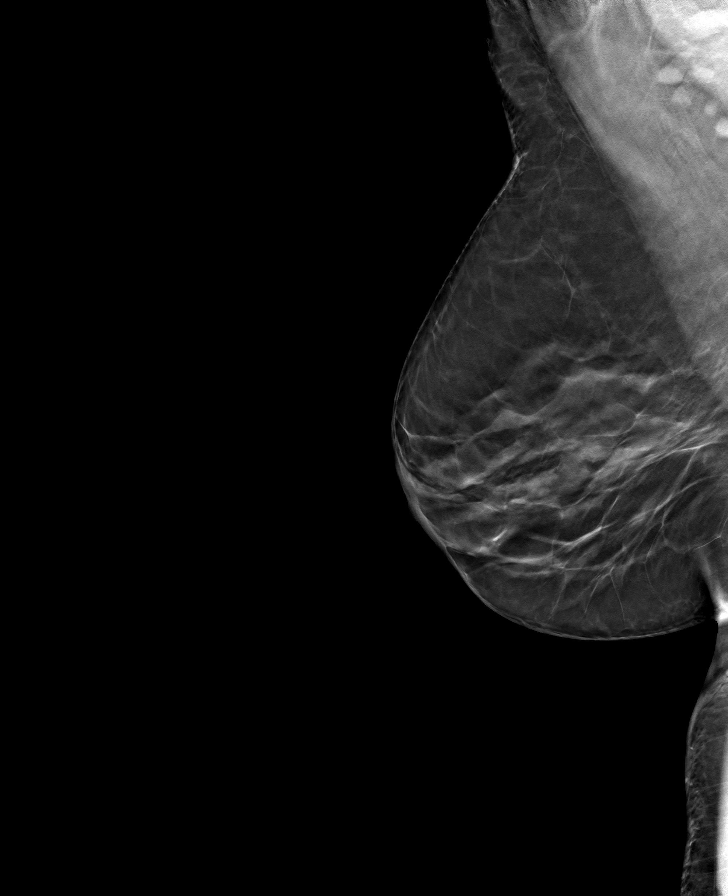

[L CC tomo · tomo slice 31/60.0]
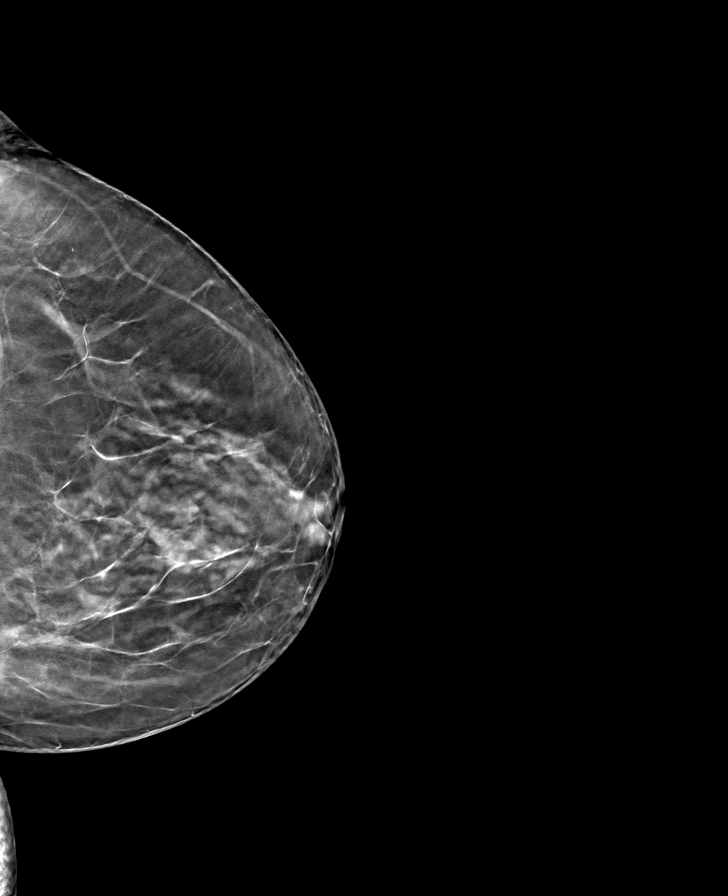

[L MLO tomo · tomo slice 35/69.0]
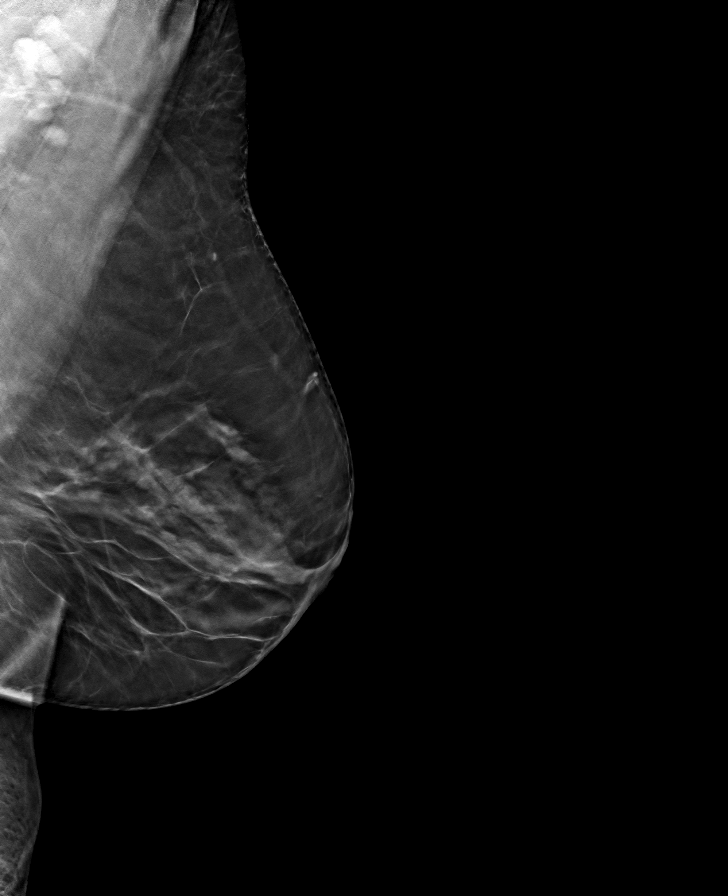

[R CC tomo · tomo slice 27/54.0]
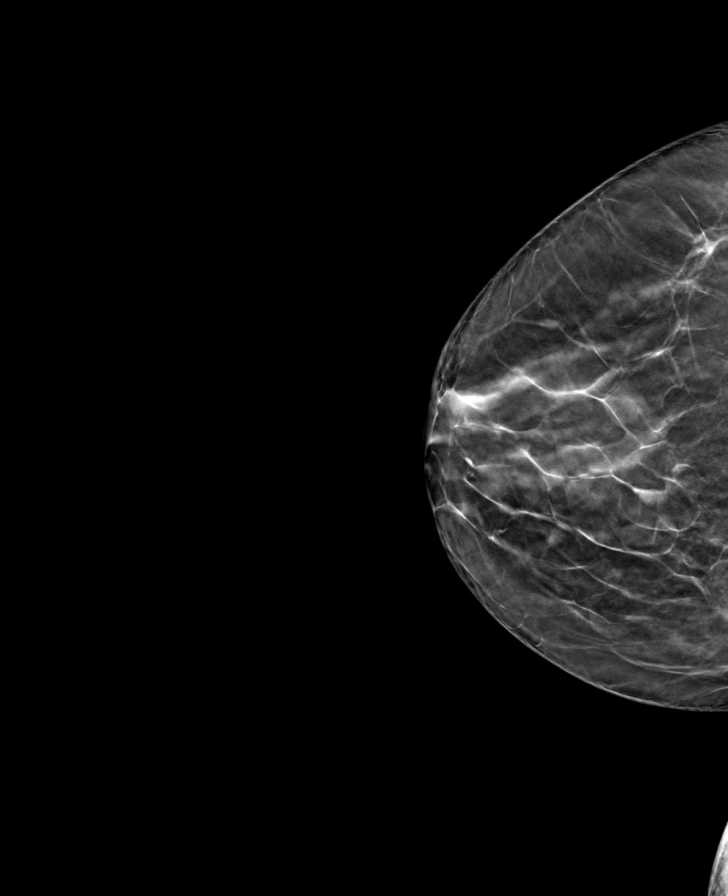

[8 of 24 positions shown; findings below may reference images not displayed]

ACR Breast Density Category c: The breast tissue is heterogeneously
dense, which may obscure small masses.
FINDINGS: There are no findings suspicious for malignancy.
IMPRESSION: No mammographic evidence of malignancy. A result letter of this
screening mammogram will be mailed directly to the patient.

RECOMMENDATION:
Screening mammogram in one year. (Code:Q3-W-BC3)

BI-RADS CATEGORY  1: Negative.

## 2023-12-06 ENCOUNTER — Encounter: Payer: Self-pay | Admitting: Emergency Medicine

## 2023-12-06 ENCOUNTER — Ambulatory Visit
Admission: EM | Admit: 2023-12-06 | Discharge: 2023-12-06 | Disposition: A | Discharge status: Home / Self Care | Attending: Nurse Practitioner | Admitting: Nurse Practitioner

## 2023-12-06 DIAGNOSIS — R1024 Suprapubic pain: Secondary | ICD-10-CM | POA: Diagnosis not present

## 2023-12-06 DIAGNOSIS — N3001 Acute cystitis with hematuria: Secondary | ICD-10-CM | POA: Insufficient documentation

## 2023-12-06 LAB — POCT URINE DIPSTICK
Bilirubin, UA: NEGATIVE
Glucose, UA: NEGATIVE mg/dL
Ketones, POC UA: NEGATIVE mg/dL
Nitrite, UA: NEGATIVE
POC PROTEIN,UA: NEGATIVE
Spec Grav, UA: 1.015 (ref 1.010–1.025)
Urobilinogen, UA: 0.2 U/dL
pH, UA: 7 (ref 5.0–8.0)

## 2023-12-06 MED ORDER — NITROFURANTOIN MONOHYD MACRO 100 MG PO CAPS: 100.0000 mg | ORAL_CAPSULE | Freq: Two times a day (BID) | ORAL | 0 refills | Status: AC

## 2023-12-06 MED ORDER — PHENAZOPYRIDINE HCL 200 MG PO TABS: 200.0000 mg | ORAL_TABLET | ORAL | 0 refills | Status: AC

## 2023-12-06 NOTE — ED Triage Notes (Signed)
 Pt presents c/o smelly urine and lower abd discomfort x 10 days. Pt reports she tried to see hr PCP but she is on vacation currently. Pt then spok to on call dr that stated he didn't believe she had a UTI and that pt should drink more water. Pt states she tried drinking more water but that only helped temporarily. Pt denies any additional sxs to report.

## 2023-12-06 NOTE — ED Provider Notes (Signed)
 [facility]    CSN: [id number] Arrival date & time: 12/06/23  1425      History   Chief Complaint Chief Complaint  Patient presents with   smelly urine   Abdominal Pain    HPI Kelli Flores is a 73 y.o. female.   Discussed the use of AI scribe software for clinical note transcription with the patient, who gave verbal consent to proceed.   The patient presents with lower abdominal discomfort and urine odor for approximately 10 days. The discomfort is described as pressure in the suprapubic area. The patient reports increased urination due to water intake, with no burning sensation but experiences a sensation of incomplete bladder emptying. The patient drinks lots of water per day and states their urine is yellow and clear.  The patient reports that she attempted to make an appointment with her PCP about this but she is currently on vacation. She had a telephone interaction with the on-call doctor who reportedly told her that he did not believe she had a UTI and told her to drink more water The patient mentions taking two rounds of amoxicillin for a tooth infection but states they do not currently have a tooth infection. The patient denies lower back pain, nausea, vomiting, headache, numbness, tingling, or fever.  The following sections of the patient's history were reviewed and updated as appropriate: allergies, current medications, past family history, past medical history, past social history, past surgical history, and problem list.      Past Medical History:  Diagnosis Date   Hypertension     Patient Active Problem List   Diagnosis Date Noted   A-V fistula 08/09/2023   Abdominal pain, periumbilical 08/09/2023   Anxiety 08/09/2023   Family history of rectal cancer 08/09/2023   Hand pain 08/09/2023   Osteopenia 08/09/2023   Overweight (BMI 25.0-29.9) 08/09/2023   Status post coil embolization of cerebral aneurysm 08/09/2023   Tinnitus 08/09/2023    Hyperlipidemia 02/01/2020   Hypertensive disorder 02/01/2020   Irritable bowel syndrome 02/01/2020   Bilateral carpal tunnel syndrome 05/11/2018    Past Surgical History:  Procedure Laterality Date   BREAST BIOPSY Right 05/20/2022   MM RT BREAST BX W LOC DEV 1ST LESION IMAGE BX SPEC STEREO GUIDE 05/20/2022 [facility]   IR ANGIO INTRA EXTRACRAN SEL COM CAROTID INNOMINATE UNI L MOD SED  11/17/2018   IR ANGIO VERTEBRAL SEL VERTEBRAL UNI L MOD SED  11/17/2018   IR GENERIC HISTORICAL  02/14/2016   IR ANGIO INTRA EXTRACRAN SEL COM CAROTID INNOMINATE BILAT MOD SED 02/14/2016 Thyra Nash, MD [facility]   IR GENERIC HISTORICAL  02/14/2016   IR ANGIO VERTEBRAL SEL VERTEBRAL BILAT MOD SED 02/14/2016 Thyra Nash, MD [facility]   IR RADIOLOGIST EVAL & MGMT  03/31/2017    OB History   No obstetric history on file.      Home Medications    Prior to Admission medications   Medication Sig Start Date End Date Taking? Authorizing Provider  nitrofurantoin, macrocrystal-monohydrate, (MACROBID) 100 MG capsule Take 1 capsule (100 mg total) by mouth 2 (two) times daily. 12/06/23  Yes Arzell Mcgeehan, FNP  phenazopyridine (PYRIDIUM) 200 MG tablet Take 1 tablet (200 mg total) by mouth 3 (three) times daily at 8am, 3pm and bedtime for 2 days. 12/06/23 12/08/23 Yes Sadeen Wiegel, FNP  Acetaminophen (TYLENOL) 325 MG CAPS 1 capsule as needed Orally every 6 hrs As needed    [provider]  ascorbic acid (VITAMIN C) 250  MG CHEW 1 tablet Orally Once a day    [provider]  aspirin EC 81 MG tablet Take 81 mg by mouth every evening.    [provider]  atenolol (TENORMIN) 100 MG tablet Take 100 mg by mouth daily.    [provider]  atenolol-chlorthalidone (TENORETIC) 50-25 MG per tablet Take 0.5 tablets by mouth every morning.  Patient not taking: Reported on 08/09/2023    [provider]  Calcium Carb-Cholecalciferol 1000-800 MG-UNIT  TABS Take 1 tablet by mouth daily with supper. Only take 5 days out of the week    [provider]  calcium carbonate (SUPER CALCIUM) 1500 (600 Ca) MG TABS tablet 1 tablet with meals by mouth Once a day    [provider]  chlorthalidone (HYGROTON) 25 MG tablet Take 25 mg by mouth every morning.    [provider]  Cholecalciferol (VITAMIN D) 125 MCG (5000 UT) CAPS Take 1,000 Units by mouth daily. 5 days out of the week Patient not taking: Reported on 08/09/2023    [provider]  Cholecalciferol 50 MCG (2000 UT) TABS 1 tablet Orally Once a day Patient not taking: Reported on 08/09/2023    [provider]  DULoxetine (CYMBALTA) 20 MG capsule Take 20 mg by mouth daily. 07/29/23   [provider]  EPINEPHrine 0.3 mg/0.3 mL IJ SOAJ injection INJECT CONTENTS OF 1 PEN AS NEEDED FOR ALLERGIC REACTION    [provider]  fluticasone (FLONASE) 50 MCG/ACT nasal spray     [provider]  levocetirizine (XYZAL) 5 MG tablet Take 1 tablet by mouth every evening.    [provider]  Multiple Vitamins-Minerals (MULTIVITAMIN WITH MINERALS) tablet Take 1 tablet by mouth daily.    [provider]  simvastatin (ZOCOR) 40 MG tablet Take 40 mg by mouth every evening.     [provider]  tiZANidine  (ZANAFLEX ) 4 MG tablet Take 0.5 tablets (2 mg total) by mouth every 6 (six) hours as needed for muscle spasms. 08/09/23   Billy Asberry FALCON, PA-C    Family History Family History  Problem Relation Age of Onset   High blood pressure Mother    Kidney failure Mother    Congestive Heart Failure Mother    Congestive Heart Failure Father    High blood pressure Father     Social History Social History   Tobacco Use   Smoking status: Never    Passive exposure: Never   Smokeless tobacco: Never  Vaping Use   Vaping status: Never Used  Substance Use Topics   Alcohol use: Yes    Comment: Occasional Drinks   Drug use: Never      Allergies   Shellfish protein-containing drug products, Shellfish allergy, and Codeine   Review of Systems Review of Systems  Constitutional:  Negative for fever.  Gastrointestinal:  Positive for abdominal pain (suprapubic pressure). Negative for nausea and vomiting.  Genitourinary:  Negative for dysuria.       Odor to urine. Sensation of not completely emptying bladder    Musculoskeletal:  Negative for back pain.  Neurological:  Positive for light-headedness (started today). Negative for dizziness, weakness, numbness and headaches.  All other systems reviewed and are negative.    Physical Exam Triage Vital Signs ED Triage Vitals  Encounter Vitals Group     BP 12/06/23 1625 (!) 143/83     Girls Systolic BP Percentile --      Girls Diastolic BP Percentile --  Boys Systolic BP Percentile --      Boys Diastolic BP Percentile --      Pulse Rate 12/06/23 1625 60     Resp 12/06/23 1625 18     Temp 12/06/23 1625 98.3 F (36.8 C)     Temp Source 12/06/23 1625 Oral     SpO2 12/06/23 1625 98 %     Weight 12/06/23 1624 145 lb (65.8 kg)     Height --      Head Circumference --      Peak Flow --      Pain Score 12/06/23 1622 7     Pain Loc --      Pain Education --      Exclude from Growth Chart --    No data found.  Updated Vital Signs BP (!) 143/83 (BP Location: Left Arm)   Pulse 60   Temp 98.3 F (36.8 C) (Oral)   Resp 18   Wt 145 lb (65.8 kg)   SpO2 98%   BMI 24.50 kg/m   Visual Acuity Right Eye Distance:   Left Eye Distance:   Bilateral Distance:    Right Eye Near:   Left Eye Near:    Bilateral Near:     Physical Exam   UC Treatments / Results  Labs (all labs ordered are listed, but only abnormal results are displayed) Labs Reviewed  POCT URINE DIPSTICK - Abnormal; Notable for the following components:      Result Value   Blood, UA small (*)    Leukocytes, UA Trace (*)    All other components within normal limits  URINE CULTURE     EKG   Radiology No results found.  Procedures Procedures (including critical care time)  Medications Ordered in UC Medications - No data to display  Initial Impression / Assessment and Plan / UC Course  I have reviewed the triage vital signs and the nursing notes.  Pertinent labs & imaging results that were available during my care of the patient were reviewed by me and considered in my medical decision making (see chart for details).     Patient presents with symptoms consistent with a urinary tract infection. Urinalysis reveals microscopic hematuria, and leukocyte esterase, supporting the diagnosis. Nitrofurantoin was prescribed to be taken twice daily for 5 days. Pyridium was prescribed for urinary discomfort, to be taken three times daily for 2 days, with counseling that it may turn the urine orange. A urine culture was sent to identify the causative organism and assess antibiotic sensitivity. Patient was advised that they will be contacted only if the culture results require a change in treatment; otherwise, results can be reviewed via MyChart. Patient instructed to increase fluid intake and monitor symptoms. Follow up with primary care provider if symptoms do not improve or worsen. Emergency evaluation is warranted for fever, back or flank pain, nausea, vomiting, or signs of systemic illness.  Today's evaluation has revealed no signs of a dangerous process. Discussed diagnosis with patient and/or guardian. Patient and/or guardian aware of their diagnosis, possible red flag symptoms to watch out for and need for close follow up. Patient and/or guardian understands verbal and written discharge instructions. Patient and/or guardian comfortable with plan and disposition.  Patient and/or guardian has a clear mental status at this time, good insight into illness (after discussion and teaching) and has clear judgment to make decisions regarding their care  Documentation was completed with  the aid of voice recognition software. Transcription may contain typographical  errors.   Final Clinical Impressions(s) / UC Diagnoses   Final diagnoses:  Acute cystitis with hematuria  Suprapubic pressure     Discharge Instructions      You were seen today for symptoms consistent with a urinary tract infection (UTI). You have been prescribed Macrobid to treat the infection and Pyridium to help relieve the bladder pressure. Take the antibiotics exactly as prescribed and complete the full course, even if you start feeling better. Pyridium  may cause your urine to change color, which is a normal side effect of the medication. A urine culture has been sent to identify the specific bacteria causing the infection and to confirm that the prescribed antibiotic is appropriate. You will only be contacted if your results are abnormal; otherwise, you may review them in your MyChart account.  It is important to stay well hydrated by drinking plenty of fluids throughout the day. This helps flush out your urinary system and keeps your urine light yellow, which is a sign of good hydration. Avoid caffeine and alcohol, as they can irritate the bladder. Be sure to urinate regularly and empty your bladder fully. Do not hold your urine for extended periods. Always wipe from front to back after using the bathroom and use a clean tissue for each wipe. Avoid douching or using sprays or powders in the genital area, as these can cause irritation. Follow up with your healthcare provider if your symptoms do not improve within a few days, get worse, or return after completing your treatment.        ED Prescriptions     Medication Sig Dispense Auth. Provider   phenazopyridine (PYRIDIUM) 200 MG tablet Take 1 tablet (200 mg total) by mouth 3 (three) times daily at 8am, 3pm and bedtime for 2 days. 6 tablet Orville Mena, FNP   nitrofurantoin, macrocrystal-monohydrate, (MACROBID) 100 MG capsule Take 1 capsule (100 mg  total) by mouth 2 (two) times daily. 10 capsule Iola Lukes, FNP      PDMP not reviewed this encounter.   Iola Lukes, [place] 12/06/23 2111

## 2023-12-06 NOTE — Discharge Instructions (Addendum)
 You were seen today for symptoms consistent with a urinary tract infection (UTI). You have been prescribed Macrobid to treat the infection and Pyridium to help relieve the bladder pressure. Take the antibiotics exactly as prescribed and complete the full course, even if you start feeling better. Pyridium  may cause your urine to change color, which is a normal side effect of the medication. A urine culture has been sent to identify the specific bacteria causing the infection and to confirm that the prescribed antibiotic is appropriate. You will only be contacted if your results are abnormal; otherwise, you may review them in your MyChart account.  It is important to stay well hydrated by drinking plenty of fluids throughout the day. This helps flush out your urinary system and keeps your urine light yellow, which is a sign of good hydration. Avoid caffeine and alcohol, as they can irritate the bladder. Be sure to urinate regularly and empty your bladder fully. Do not hold your urine for extended periods. Always wipe from front to back after using the bathroom and use a clean tissue for each wipe. Avoid douching or using sprays or powders in the genital area, as these can cause irritation. Follow up with your healthcare provider if your symptoms do not improve within a few days, get worse, or return after completing your treatment.

## 2023-12-08 ENCOUNTER — Ambulatory Visit (HOSPITAL_COMMUNITY): Payer: Self-pay

## 2023-12-08 LAB — URINE CULTURE: Culture: >=100000 — AB

## 2024-01-03 ENCOUNTER — Encounter: Payer: Self-pay | Admitting: Emergency Medicine

## 2024-01-03 ENCOUNTER — Ambulatory Visit: Admission: EM | Admit: 2024-01-03 | Discharge: 2024-01-03 | Disposition: A | Discharge status: Home / Self Care

## 2024-01-03 DIAGNOSIS — H6992 Unspecified Eustachian tube disorder, left ear: Secondary | ICD-10-CM

## 2024-01-03 MED ORDER — PREDNISONE 50 MG PO TABS: ORAL_TABLET | ORAL | 0 refills | Status: AC

## 2024-01-03 MED ORDER — FLUTICASONE PROPIONATE 50 MCG/ACT NA SUSP: 1.0000 | Freq: Every day | NASAL | 0 refills | Status: AC

## 2024-01-03 NOTE — ED Provider Notes (Signed)
 [facility]    CSN: [id number] Arrival date & time: 01/03/24  1436      History   Chief Complaint Chief Complaint  Patient presents with   Dizziness    HPI Rosio Dustie Flores is a 73 y.o. female.   Patient presents today due to fullness in the left ear for the past couple of days.  Patient states she was diagnosed with a sinus infection and ear infection last week and was prescribed a Z-Pak for it.  Patient states that on the second day of the Z-Pak she started experiencing dizziness, patient states that the dizziness was associated with nausea and diarrhea.  Patient states that dizziness is resolved when she was at work yesterday and was advised to drink more water by one of her friends.  Patient states that with more hydration she felt like her ear opened up as well.  Patient states that she has not had dizziness since yesterday but states that the hearing in her left ear is muffled and she has fullness in her left ear.  The history is provided by the patient.  Dizziness   Past Medical History:  Diagnosis Date   Hypertension     Patient Active Problem List   Diagnosis Date Noted   A-V fistula 08/09/2023   Abdominal pain, periumbilical 08/09/2023   Anxiety 08/09/2023   Family history of rectal cancer 08/09/2023   Hand pain 08/09/2023   Osteopenia 08/09/2023   Overweight (BMI 25.0-29.9) 08/09/2023   Status post coil embolization of cerebral aneurysm 08/09/2023   Tinnitus 08/09/2023   Hyperlipidemia 02/01/2020   Hypertensive disorder 02/01/2020   Irritable bowel syndrome 02/01/2020   Bilateral carpal tunnel syndrome 05/11/2018    Past Surgical History:  Procedure Laterality Date   BREAST BIOPSY Right 05/20/2022   MM RT BREAST BX W LOC DEV 1ST LESION IMAGE BX SPEC STEREO GUIDE 05/20/2022 [facility]   IR ANGIO INTRA EXTRACRAN SEL COM CAROTID INNOMINATE UNI L MOD SED  11/17/2018   IR ANGIO VERTEBRAL SEL VERTEBRAL UNI L MOD SED  11/17/2018   IR  GENERIC HISTORICAL  02/14/2016   IR ANGIO INTRA EXTRACRAN SEL COM CAROTID INNOMINATE BILAT MOD SED 02/14/2016 Thyra Nash, MD [facility]   IR GENERIC HISTORICAL  02/14/2016   IR ANGIO VERTEBRAL SEL VERTEBRAL BILAT MOD SED 02/14/2016 Thyra Nash, MD [facility]   IR RADIOLOGIST EVAL & MGMT  03/31/2017    OB History   No obstetric history on file.      Home Medications    Prior to Admission medications   Medication Sig Start Date End Date Taking? Authorizing Provider  amoxicillin (AMOXIL) 500 MG capsule Oral 12/16/22  Yes [provider]  azithromycin (ZITHROMAX) 250 MG tablet as directed Orally daily; Duration: 5 days Take 500mg  (2) day 1, Day 2-5 take 1 tablet daily 12/27/23  Yes [provider]  cephALEXin (KEFLEX) 500 MG capsule Oral 10/07/22  Yes [provider]  diclofenac Sodium (VOLTAREN) 1 % GEL apply a small amount Externally three times daily; Duration: 30 days As needed for pain 04/27/23  Yes [provider]  fluticasone (FLONASE) 50 MCG/ACT nasal spray Place 1 spray into both nostrils daily. 01/03/24  Yes Andra Corean BROCKS, PA-C  predniSONE  (DELTASONE ) 50 MG tablet Take tab po daily for 5 days 01/03/24  Yes Andra Corean BROCKS, PA-C  Acetaminophen (TYLENOL) 325 MG CAPS 1 capsule as needed Orally every 6 hrs As needed    [provider]  ascorbic  acid (VITAMIN C) 250 MG CHEW 1 tablet Orally Once a day    [provider]  aspirin EC 81 MG tablet Take 81 mg by mouth every evening.    [provider]  atenolol (TENORMIN) 100 MG tablet Take 100 mg by mouth daily.    [provider]  atenolol-chlorthalidone (TENORETIC) 50-25 MG per tablet Take 0.5 tablets by mouth every morning.  Patient not taking: Reported on 08/09/2023    [provider]  Calcium Carb-Cholecalciferol 1000-800 MG-UNIT TABS Take 1 tablet by mouth daily with supper. Only take 5 days out of the week    [provider]  calcium carbonate (SUPER CALCIUM) 1500 (600 Ca) MG TABS tablet 1 tablet with meals by mouth Once a day    [provider]  chlorthalidone (HYGROTON) 25 MG tablet Take 25 mg by mouth every morning.    [provider]  Cholecalciferol (VITAMIN D) 125 MCG (5000 UT) CAPS Take 1,000 Units by mouth daily. 5 days out of the week Patient not taking: Reported on 08/09/2023    [provider]  Cholecalciferol 50 MCG (2000 UT) TABS 1 tablet Orally Once a day Patient not taking: Reported on 08/09/2023    [provider]  DULoxetine (CYMBALTA) 20 MG capsule Take 20 mg by mouth daily. 07/29/23   [provider]  EPINEPHrine 0.3 mg/0.3 mL IJ SOAJ injection INJECT CONTENTS OF 1 PEN AS NEEDED FOR ALLERGIC REACTION    [provider]  levocetirizine (XYZAL) 5 MG tablet Take 1 tablet by mouth every evening.    [provider]  Multiple Vitamins-Minerals (MULTIVITAMIN WITH MINERALS) tablet Take 1 tablet by mouth daily.    [provider]  nitrofurantoin, macrocrystal-monohydrate, (MACROBID) 100 MG capsule Take 1 capsule (100 mg total) by mouth 2 (two) times daily. 12/06/23   Murrill, Samantha, FNP  simvastatin (ZOCOR) 40 MG tablet Take 40 mg by mouth every evening.     [provider]  tiZANidine  (ZANAFLEX ) 4 MG tablet Take 0.5 tablets (2 mg total) by mouth every 6 (six) hours as needed for muscle spasms. 08/09/23   Billy Asberry FALCON, PA-C    Family History Family History  Problem Relation Age of Onset   High blood pressure Mother    Kidney failure Mother    Congestive Heart Failure Mother    Congestive Heart Failure Father    High blood pressure Father     Social History Social History   Tobacco Use   Smoking status: Never    Passive exposure: Never   Smokeless tobacco: Never  Vaping Use   Vaping status: Never Used  Substance Use Topics   Alcohol use: Yes    Comment: Occasional Drinks   Drug use: Never      Allergies   Shellfish protein-containing drug products, Shellfish allergy, and Codeine   Review of Systems Review of Systems  Neurological:  Positive for dizziness.     Physical Exam Triage Vital Signs ED Triage Vitals  Encounter Vitals Group     BP 01/03/24 1748 (!) 169/95     Girls Systolic BP Percentile --      Girls Diastolic BP Percentile --      Boys Systolic BP Percentile --      Boys Diastolic BP Percentile --      Pulse Rate 01/03/24 1748 (!) 59     Resp 01/03/24 1748 18     Temp 01/03/24 1748 98.2 F (36.8 C)  Temp Source 01/03/24 1748 Oral     SpO2 01/03/24 1748 98 %     Weight 01/03/24 1748 145 lb 1 oz (65.8 kg)     Height --      Head Circumference --      Peak Flow --      Pain Score 01/03/24 1747 0     Pain Loc --      Pain Education --      Exclude from Growth Chart --    No data found.  Updated Vital Signs BP (!) 169/95 (BP Location: Left Arm)   Pulse (!) 59   Temp 98.2 F (36.8 C) (Oral)   Resp 18   Wt 145 lb 1 oz (65.8 kg)   SpO2 98%   BMI 24.52 kg/m   Visual Acuity Right Eye Distance:   Left Eye Distance:   Bilateral Distance:    Right Eye Near:   Left Eye Near:    Bilateral Near:     Physical Exam Vitals and nursing note reviewed.  Constitutional:      General: She is not in acute distress.    Appearance: Normal appearance. She is not ill-appearing, toxic-appearing or diaphoretic.  HENT:     Right Ear: Tympanic membrane, ear canal and external ear normal.     Left Ear: Tympanic membrane, ear canal and external ear normal.     Nose: Congestion (moderately enlarged turbinates) present. No rhinorrhea.     Mouth/Throat:     Mouth: Mucous membranes are moist.     Pharynx: Oropharynx is clear. No oropharyngeal exudate or posterior oropharyngeal erythema.  Eyes:     General: No scleral icterus. Cardiovascular:     Rate and Rhythm: Normal rate and regular rhythm.     Heart sounds: Normal heart sounds.  Pulmonary:      Effort: Pulmonary effort is normal. No respiratory distress.     Breath sounds: Normal breath sounds. No wheezing or rhonchi.  Skin:    General: Skin is warm.  Neurological:     Mental Status: She is alert and oriented to person, place, and time.  Psychiatric:        Mood and Affect: Mood normal.        Behavior: Behavior normal.      UC Treatments / Results  Labs (all labs ordered are listed, but only abnormal results are displayed) Labs Reviewed - No data to display  EKG   Radiology No results found.  Procedures Procedures (including critical care time)  Medications Ordered in UC Medications - No data to display  Initial Impression / Assessment and Plan / UC Course  I have reviewed the triage vital signs and the nursing notes.  Pertinent labs & imaging results that were available during my care of the patient were reviewed by me and considered in my medical decision making (see chart for details).     Final Clinical Impressions(s) / UC Diagnoses   Final diagnoses:  Eustachian tube dysfunction, left   Discharge Instructions   None    ED Prescriptions     Medication Sig Dispense Auth. Provider   fluticasone (FLONASE) 50 MCG/ACT nasal spray Place 1 spray into both nostrils daily. 16 g Jeran Hiltz C, PA-C   predniSONE  (DELTASONE ) 50 MG tablet Take tab po daily for 5 days 5 tablet Andra Corean BROCKS, PA-C      PDMP not reviewed this encounter.   Andra Corean BROCKS, PA-C 01/03/24 1827

## 2024-01-03 NOTE — ED Triage Notes (Signed)
 Pt presents c/o dizziness x 2 days. Pt states,  I have an ear and sinus infection. It started a week ago. I was put on a zpack by my pcp. The next morning I woke up with vertigo. I took my last pill on Saturday. I thought I was doing better since the pain went away and my ear opened up. Then, over night (yesterday) the sxs returned with the dizziness and the ear feeling clogged and having the need to walk the wall to keep from stumbling. My PCP can't get me in so I came back here.

## 2024-03-08 ENCOUNTER — Other Ambulatory Visit: Payer: Self-pay | Admitting: Family

## 2024-03-08 DIAGNOSIS — Z1231 Encounter for screening mammogram for malignant neoplasm of breast: Secondary | ICD-10-CM

## 2024-04-26 ENCOUNTER — Ambulatory Visit
